# Patient Record
Sex: Male | Born: 1990 | Race: Black or African American | Hispanic: No | Marital: Single | Smoking: Never smoker
Health system: Southern US, Community
[De-identification: ages and names within clinical notes are randomized; demographics above are authoritative.]

## PROBLEM LIST (undated history)

## (undated) DIAGNOSIS — Z87442 Personal history of urinary calculi: Secondary | ICD-10-CM

## (undated) HISTORY — PX: WISDOM TOOTH EXTRACTION: SHX21

---
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
## 2011-05-02 ENCOUNTER — Encounter: Payer: Self-pay | Admitting: Emergency Medicine

## 2011-05-02 ENCOUNTER — Emergency Department (HOSPITAL_COMMUNITY): Payer: 59

## 2011-05-02 ENCOUNTER — Emergency Department (HOSPITAL_COMMUNITY)
Admission: EM | Admit: 2011-05-02 | Discharge: 2011-05-02 | Disposition: A | Discharge status: Home / Self Care | Payer: 59 | Attending: Emergency Medicine | Admitting: Emergency Medicine

## 2011-05-02 DIAGNOSIS — IMO0002 Reserved for concepts with insufficient information to code with codable children: Secondary | ICD-10-CM

## 2011-05-02 DIAGNOSIS — W1789XA Other fall from one level to another, initial encounter: Secondary | ICD-10-CM | POA: Insufficient documentation

## 2011-05-02 DIAGNOSIS — W19XXXA Unspecified fall, initial encounter: Secondary | ICD-10-CM

## 2011-05-02 DIAGNOSIS — S0990XA Unspecified injury of head, initial encounter: Secondary | ICD-10-CM

## 2011-05-02 DIAGNOSIS — F1010 Alcohol abuse, uncomplicated: Secondary | ICD-10-CM | POA: Insufficient documentation

## 2011-05-02 DIAGNOSIS — R51 Headache: Secondary | ICD-10-CM | POA: Insufficient documentation

## 2011-05-02 LAB — CBC
HCT: 40.5 % (ref 39.0–52.0)
MCH: 32.4 pg (ref 26.0–34.0)
MCHC: 35 g/dL (ref 30.0–36.0)
MCHC: 35.1 g/dL (ref 30.0–36.0)
MCV: 92.5 fL (ref 78.0–100.0)
RDW: 12.4 % (ref 11.5–15.5)
RDW: 12.3 % (ref 11.5–15.5)
WBC: 22.8 10*3/uL — ABNORMAL HIGH (ref 4.0–10.5)
WBC: 17.4 10*3/uL — ABNORMAL HIGH (ref 4.0–10.5)

## 2011-05-02 LAB — POCT I-STAT, CHEM 8
BUN: 12 mg/dL (ref 6–23)
Calcium, Ion: 1.21 mmol/L (ref 1.12–1.32)
Chloride: 98 mEq/L (ref 96–112)
Creatinine, Ser: 1 mg/dL (ref 0.50–1.35)
Glucose, Bld: 107 mg/dL — ABNORMAL HIGH (ref 70–99)

## 2011-05-02 LAB — DIFFERENTIAL
Basophils Absolute: 0 10*3/uL (ref 0.0–0.1)
Basophils Relative: 0 % (ref 0–1)
Lymphocytes Relative: 5 % — ABNORMAL LOW (ref 12–46)
Neutro Abs: 20.5 10*3/uL — ABNORMAL HIGH (ref 1.7–7.7)
Neutrophils Relative %: 90 % — ABNORMAL HIGH (ref 43–77)

## 2011-05-02 LAB — RAPID URINE DRUG SCREEN, HOSP PERFORMED
Amphetamines: NOT DETECTED
Benzodiazepines: NOT DETECTED
Cocaine: NOT DETECTED
Opiates: NOT DETECTED
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
## 2011-05-02 NOTE — ED Notes (Signed)
Please contact Father: Dimitriy Carreras at cell: [phone], and the home number is [phone].
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
## 2011-05-02 NOTE — ED Notes (Signed)
Attempted to call father at home and cell with no response.

## 2011-05-02 NOTE — ED Notes (Signed)
Pt able to ambulate. Pt denies need to urinate. Pt states that he feels better.
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
## 2012-06-07 ENCOUNTER — Encounter (HOSPITAL_COMMUNITY): Payer: Self-pay | Admitting: Emergency Medicine

## 2012-06-07 ENCOUNTER — Emergency Department (HOSPITAL_COMMUNITY)
Admission: EM | Admit: 2012-06-07 | Discharge: 2012-06-07 | Disposition: A | Discharge status: Home / Self Care | Payer: 59 | Attending: Emergency Medicine | Admitting: Emergency Medicine

## 2012-06-07 ENCOUNTER — Emergency Department (HOSPITAL_COMMUNITY): Payer: 59

## 2012-06-07 DIAGNOSIS — IMO0002 Reserved for concepts with insufficient information to code with codable children: Secondary | ICD-10-CM | POA: Insufficient documentation

## 2012-06-07 DIAGNOSIS — Y929 Unspecified place or not applicable: Secondary | ICD-10-CM | POA: Insufficient documentation

## 2012-06-07 DIAGNOSIS — S46912A Strain of unspecified muscle, fascia and tendon at shoulder and upper arm level, left arm, initial encounter: Secondary | ICD-10-CM

## 2012-06-07 DIAGNOSIS — Y9389 Activity, other specified: Secondary | ICD-10-CM | POA: Insufficient documentation

## 2012-06-07 DIAGNOSIS — X503XXA Overexertion from repetitive movements, initial encounter: Secondary | ICD-10-CM | POA: Insufficient documentation

## 2012-06-07 MED ORDER — IBUPROFEN 200 MG PO TABS
600.0000 mg | ORAL_TABLET | Freq: Once | ORAL | Status: AC
  Administered 2012-06-07: 600 mg via ORAL
  Filled 2012-06-07: qty 3

## 2012-06-07 MED ORDER — IBUPROFEN 400 MG PO TABS: 600.0000 mg | ORAL_TABLET | Freq: Once | ORAL | Status: DC

## 2012-06-07 MED ORDER — IBUPROFEN 600 MG PO TABS: 600.0000 mg | ORAL_TABLET | Freq: Four times a day (QID) | ORAL | Status: DC | PRN

## 2012-06-07 NOTE — ED Notes (Signed)
Patient ambulatory with radiology tech from xray and escorted to FT 9

## 2012-06-07 NOTE — ED Provider Notes (Signed)
Medical screening examination/treatment/procedure(s) were performed by non-physician practitioner and as supervising physician I was immediately available for consultation/collaboration.  Sunnie Nielsen, MD 06/07/12 [phone]

## 2012-06-07 NOTE — ED Notes (Signed)
PT. 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

## 2012-06-07 NOTE — ED Provider Notes (Signed)
History     CSN: [id number]  Arrival date & time 06/07/12  2952   First MD Initiated Contact with Patient 06/07/12 [phone]      Chief Complaint  Patient presents with  . 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 male presenting with shoulder pain. The history is provided by the patient.  Shoulder Pain This is a new problem. The current episode started in the past 7 days. The problem occurs constantly. The problem has been unchanged. Pertinent negatives include no chest pain, chills, fever, joint swelling, myalgias, numbness, rash or weakness.    History reviewed. No pertinent past medical history.  History reviewed. No pertinent past surgical history.  No family history on file.  History  Substance Use Topics  . Smoking status: Never Smoker   . Smokeless tobacco: Not on file  . Alcohol Use: No      Review of Systems  Constitutional: Negative for fever, chills and activity change.  Respiratory: Negative for shortness of breath.   Cardiovascular: Negative for chest pain.  Gastrointestinal: Negative.   Musculoskeletal: Negative for myalgias and joint swelling.  Skin: Negative for rash and wound.  Neurological: Negative for dizziness, weakness and numbness.    Allergies  Peanut-containing drug products  Home Medications   Current Outpatient Rx  Name  Route  Sig  Dispense  Refill  . IBUPROFEN 600 MG PO TABS   Oral   Take 1 tablet (600 mg total) by mouth every 6 (six) hours as needed for pain.   30 tablet   0     BP 127/71  Pulse 62  Temp 98.1 F (36.7 C) (Oral)  Resp 14  SpO2 100%  Physical Exam  Constitutional: He appears well-developed and well-nourished.  Neck: Normal range of  motion.  Cardiovascular: Normal rate.   Pulmonary/Chest: Effort normal. He exhibits tenderness.  Musculoskeletal: Normal range of motion. He exhibits no edema and no tenderness.       No decreased in ROM   Neurological: He is alert.  Skin: Skin is warm.    ED Course  Procedures (including critical care time)  Labs Reviewed - No data to display Dg Shoulder Left  06/07/2012  *RADIOLOGY REPORT*  Clinical Data: Left shoulder pain, no known injury  LEFT SHOULDER - 2+ VIEW  Comparison: None.  Findings: No fracture or dislocation is seen.  The joint spaces are preserved.  The visualized soft tissues are unremarkable.  Visualized left lung is clear.  IMPRESSION: Normal left shoulder radiographs.   Original Report Authenticated By: Charline Bills, M.D.      1. Left shoulder strain       MDM  Muscle strain        Arman Filter, NP 06/07/12 [phone]

## 2013-12-13 ENCOUNTER — Encounter (HOSPITAL_COMMUNITY): Payer: Self-pay | Admitting: Emergency Medicine

## 2013-12-13 ENCOUNTER — Emergency Department (HOSPITAL_COMMUNITY)
Admission: EM | Admit: 2013-12-13 | Discharge: 2013-12-13 | Disposition: A | Discharge status: Home / Self Care | Payer: Managed Care, Other (non HMO) | Attending: Emergency Medicine | Admitting: Emergency Medicine

## 2013-12-13 DIAGNOSIS — R369 Urethral discharge, unspecified: Secondary | ICD-10-CM

## 2013-12-13 DIAGNOSIS — R599 Enlarged lymph nodes, unspecified: Secondary | ICD-10-CM | POA: Insufficient documentation

## 2013-12-13 LAB — URINALYSIS, ROUTINE W REFLEX MICROSCOPIC
GLUCOSE, UA: NEGATIVE mg/dL
Ketones, ur: NEGATIVE mg/dL
NITRITE: NEGATIVE
PH: 6 (ref 5.0–8.0)
PROTEIN: 30 mg/dL — AB
Specific Gravity, Urine: >1.03 — ABNORMAL HIGH (ref 1.005–1.030)
Urobilinogen, UA: 1 mg/dL (ref 0.0–1.0)

## 2013-12-13 LAB — URINE MICROSCOPIC-ADD ON

## 2013-12-13 MED ORDER — AZITHROMYCIN 250 MG PO TABS
1000.0000 mg | ORAL_TABLET | Freq: Once | ORAL | Status: AC
  Administered 2013-12-13: 1000 mg via ORAL
  Filled 2013-12-13: qty 4

## 2013-12-13 MED ORDER — CEFTRIAXONE SODIUM 250 MG IJ SOLR
250.0000 mg | Freq: Once | INTRAMUSCULAR | Status: AC
  Administered 2013-12-13: 250 mg via INTRAMUSCULAR
  Filled 2013-12-13: qty 250

## 2013-12-13 MED ORDER — LIDOCAINE HCL (PF) 1 % IJ SOLN
INTRAMUSCULAR | Status: AC
  Administered 2013-12-13: 5 mL
  Filled 2013-12-13: qty 5

## 2013-12-13 NOTE — ED Provider Notes (Signed)
CSN: [id number]     Arrival date & time 12/13/13  [id number] History   First MD Initiated Contact with Patient 12/13/13 [phone]     Chief Complaint  Patient presents with  . Hematuria     (Consider location/radiation/quality/duration/timing/severity/associated sxs/prior Treatment) Patient is a 23 y.o. male presenting with hematuria. The history is provided by the patient.  Hematuria This is a new problem. The current episode started 3 to 5 hours ago. The problem occurs constantly. The problem has not changed since onset.Pertinent negatives include no chest pain and no shortness of breath. Nothing aggravates the symptoms. Nothing relieves the symptoms.    History reviewed. No pertinent past medical history. History reviewed. No pertinent past surgical history. History reviewed. No pertinent family history. History  Substance Use Topics  . Smoking status: Never Smoker   . Smokeless tobacco: Not on file  . Alcohol Use: No    Review of Systems  Constitutional: Negative for fever and chills.  Respiratory: Negative for cough and shortness of breath.   Cardiovascular: Negative for chest pain.  Genitourinary: Positive for hematuria.  All other systems reviewed and are negative.     Allergies  Peanut-containing drug products  Home Medications   Prior to Admission medications   Medication Sig Start Date End Date Taking? Authorizing Dawon Troop  ibuprofen (ADVIL,MOTRIN) 600 MG tablet Take 1 tablet (600 mg total) by mouth every 6 (six) hours as needed for pain. 06/07/12   Arman FilterGail K Schulz, NP   BP 123/71  Pulse 72  Temp(Src) 97.9 F (36.6 C) (Oral)  Resp 18  Ht 5\' 7"  (1.702 m)  Wt 155 lb (70.308 kg)  BMI 24.27 kg/m2  SpO2 99% Physical Exam  Nursing note and vitals reviewed. Constitutional: He is oriented to person, place, and time. He appears well-developed and well-nourished. No distress.  HENT:  Head: Normocephalic and atraumatic.  Mouth/Throat: Oropharynx is clear and moist. No  oropharyngeal exudate.  Eyes: EOM are normal. Pupils are equal, round, and reactive to light.  Neck: Normal range of motion. Neck supple.  Cardiovascular: Normal rate and regular rhythm.  Exam reveals no friction rub.   No murmur heard. Pulmonary/Chest: Effort normal and breath sounds normal. No respiratory distress. He has no wheezes. He has no rales.  Abdominal: Soft. He exhibits no distension. There is no tenderness. There is no rebound.  Genitourinary: No paraphimosis, hypospadias or penile tenderness. Discharge found.  Musculoskeletal: Normal range of motion. He exhibits no edema.  Lymphadenopathy:       Left: Inguinal (tender) adenopathy present.  Neurological: He is alert and oriented to person, place, and time. He exhibits normal muscle tone.  Skin: No rash noted. He is not diaphoretic.    ED Course  Procedures (including critical care time) Labs Review Labs Reviewed  URINALYSIS, ROUTINE W REFLEX MICROSCOPIC - Abnormal; Notable for the following:    Color, Urine BROWN (*)    APPearance CLOUDY (*)    Specific Gravity, Urine >1.030 (*)    Hgb urine dipstick LARGE (*)    Bilirubin Urine SMALL (*)    Protein, ur 30 (*)    Leukocytes, UA MODERATE (*)    All other components within normal limits  GC/CHLAMYDIA PROBE AMP  URINE MICROSCOPIC-ADD ON    Imaging Review No results found.   EKG Interpretation None      MDM   Final diagnoses:  Penile discharge    46M presents with acute onset of penile discharge. Mild bloody and purulent discharge. L inguinal  lymphadenopathy, likely reactive. Mild dysuria. Given antibiotics for GC/Ch. Discharged.     Dagmar HaitWilliam Blair Walden, MD 12/13/13 [phone]

## 2013-12-13 NOTE — ED Notes (Addendum)
Patient presents from home stating he noticed blood in his urine this AM  Denies back pain, states he has a lump in his left groin area.  Parents st bedside.

## 2013-12-14 LAB — GC/CHLAMYDIA PROBE AMP
CT PROBE, AMP APTIMA: POSITIVE — AB
GC Probe RNA: POSITIVE — AB

## 2013-12-15 ENCOUNTER — Telehealth (HOSPITAL_BASED_OUTPATIENT_CLINIC_OR_DEPARTMENT_OTHER): Payer: Self-pay | Admitting: Emergency Medicine

## 2013-12-15 NOTE — Telephone Encounter (Signed)
Positive Gonorrhea & Positive Chlamydia cultures. treated with Rocephin and Zithromax, sensitive to same per protocol [facility]. [facility]  Patient contact 12/15/13 of positive results and that treatment was given in ED. STD instructions provided, pt verbalized understanding.

## 2014-04-25 ENCOUNTER — Ambulatory Visit: Payer: 59 | Admitting: Family

## 2016-12-24 ENCOUNTER — Emergency Department (HOSPITAL_COMMUNITY): Payer: No Typology Code available for payment source

## 2016-12-24 ENCOUNTER — Encounter (HOSPITAL_COMMUNITY): Payer: Self-pay | Admitting: Emergency Medicine

## 2016-12-24 ENCOUNTER — Emergency Department (HOSPITAL_COMMUNITY)
Admission: EM | Admit: 2016-12-24 | Discharge: 2016-12-24 | Disposition: A | Discharge status: Home / Self Care | Payer: No Typology Code available for payment source | Attending: Emergency Medicine | Admitting: Emergency Medicine

## 2016-12-24 DIAGNOSIS — S6982XA Other specified injuries of left wrist, hand and finger(s), initial encounter: Secondary | ICD-10-CM | POA: Diagnosis present

## 2016-12-24 DIAGNOSIS — Y92410 Unspecified street and highway as the place of occurrence of the external cause: Secondary | ICD-10-CM | POA: Insufficient documentation

## 2016-12-24 DIAGNOSIS — Y999 Unspecified external cause status: Secondary | ICD-10-CM | POA: Diagnosis not present

## 2016-12-24 DIAGNOSIS — Y939 Activity, unspecified: Secondary | ICD-10-CM | POA: Diagnosis not present

## 2016-12-24 DIAGNOSIS — S60222A Contusion of left hand, initial encounter: Secondary | ICD-10-CM | POA: Diagnosis not present

## 2016-12-24 MED ORDER — METHOCARBAMOL 500 MG PO TABS: 500.0000 mg | ORAL_TABLET | Freq: Two times a day (BID) | ORAL | 0 refills | Status: DC

## 2016-12-24 MED ORDER — NAPROXEN 375 MG PO TABS: 375.0000 mg | ORAL_TABLET | Freq: Two times a day (BID) | ORAL | 0 refills | Status: DC
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
## 2016-12-24 NOTE — ED Notes (Signed)
Back from xray

## 2016-12-24 NOTE — ED Notes (Signed)
Taken to xray at this time. 
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
## 2016-12-29 ENCOUNTER — Encounter (HOSPITAL_COMMUNITY): Payer: Self-pay | Admitting: Emergency Medicine

## 2016-12-29 ENCOUNTER — Ambulatory Visit (INDEPENDENT_AMBULATORY_CARE_PROVIDER_SITE_OTHER): Payer: Commercial Managed Care - PPO | Admitting: Physician Assistant

## 2016-12-29 ENCOUNTER — Ambulatory Visit (INDEPENDENT_AMBULATORY_CARE_PROVIDER_SITE_OTHER): Payer: Commercial Managed Care - PPO

## 2016-12-29 ENCOUNTER — Emergency Department (HOSPITAL_COMMUNITY)
Admission: EM | Admit: 2016-12-29 | Discharge: 2016-12-29 | Disposition: A | Discharge status: Home / Self Care | Payer: No Typology Code available for payment source | Attending: Emergency Medicine | Admitting: Emergency Medicine

## 2016-12-29 ENCOUNTER — Encounter: Payer: Self-pay | Admitting: Physician Assistant

## 2016-12-29 VITALS — BP 120/80 | HR 73 | Temp 98.5°F | Ht 67.5 in | Wt 158.0 lb

## 2016-12-29 DIAGNOSIS — M545 Low back pain, unspecified: Secondary | ICD-10-CM

## 2016-12-29 DIAGNOSIS — Z91010 Allergy to peanuts: Secondary | ICD-10-CM | POA: Insufficient documentation

## 2016-12-29 DIAGNOSIS — Z79899 Other long term (current) drug therapy: Secondary | ICD-10-CM | POA: Insufficient documentation

## 2016-12-29 DIAGNOSIS — H9202 Otalgia, left ear: Secondary | ICD-10-CM | POA: Diagnosis present

## 2016-12-29 DIAGNOSIS — R350 Frequency of micturition: Secondary | ICD-10-CM

## 2016-12-29 LAB — URINALYSIS, ROUTINE W REFLEX MICROSCOPIC
BILIRUBIN URINE: NEGATIVE
Hgb urine dipstick: NEGATIVE
Ketones, ur: NEGATIVE
LEUKOCYTES UA: NEGATIVE
NITRITE: NEGATIVE
RBC / HPF: NONE SEEN (ref 0–?)
Specific Gravity, Urine: >=1.03 — AB (ref 1.000–1.030)
Total Protein, Urine: NEGATIVE
URINE GLUCOSE: NEGATIVE
Urobilinogen, UA: 0.2 (ref 0.0–1.0)
pH: 6 (ref 5.0–8.0)

## 2016-12-29 MED ORDER — METHYLPREDNISOLONE 4 MG PO TBPK: ORAL_TABLET | ORAL | 0 refills | Status: DC

## 2016-12-29 MED ORDER — IBUPROFEN 600 MG PO TABS: 600.0000 mg | ORAL_TABLET | Freq: Four times a day (QID) | ORAL | 0 refills | Status: DC | PRN

## 2016-12-29 MED ORDER — AMOXICILLIN 500 MG PO CAPS: 500.0000 mg | ORAL_CAPSULE | Freq: Three times a day (TID) | ORAL | 0 refills | Status: DC
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
## 2016-12-29 NOTE — Patient Instructions (Addendum)
It was great to meet you!  MEDROL DOSE PACK Usual Directions for Use Directions for Medrol Dosepak (4 mg tablets): Day 1: Two tablets before breakfast, one after lunch, one after dinner, and two at bedtime. If started late in the day, take all six tablets at once or divide into two or three doses, unless otherwise directed by prescriber. Day 2: One tablet before breakfast, one after lunch, one after dinner, and two at bedtime Day 3: One tablet before breakfast, one after lunch, one after dinner, and one at bedtime Day 4: One tablet before breakfast, one after lunch, and one at bedtime Day 5: One tablet before breakfast and one at bedtime Day 6: One tablet before breakfast   We will call you with your xray and urine results.  Follow-up with Dr. Gaspar BiddingMichael Rigby (sports medicine doctor) here in our office if your back pain worsens or does not improve with treatment.  If severe pain, fevers or loss of ability to control bowel/bladder --> go to the ER!  Follow-up with us in 1 month to complete a physical.
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
## 2016-12-29 NOTE — ED Notes (Signed)
Pt verbalized understanding discharge instructions and denies any further needs or questions at this time. VS stable, ambulatory and steady gait.   

## 2017-01-01 ENCOUNTER — Ambulatory Visit: Payer: Commercial Managed Care - PPO | Admitting: Family Medicine

## 2017-01-06 ENCOUNTER — Encounter: Payer: Self-pay | Admitting: *Deleted

## 2019-08-12 ENCOUNTER — Encounter (HOSPITAL_COMMUNITY): Payer: Self-pay

## 2019-08-12 ENCOUNTER — Ambulatory Visit (HOSPITAL_COMMUNITY)
Admission: EM | Admit: 2019-08-12 | Discharge: 2019-08-12 | Disposition: A | Discharge status: Home / Self Care | Payer: BC Managed Care – PPO | Attending: Family Medicine | Admitting: Family Medicine

## 2019-08-12 ENCOUNTER — Other Ambulatory Visit: Payer: Self-pay

## 2019-08-12 ENCOUNTER — Ambulatory Visit (INDEPENDENT_AMBULATORY_CARE_PROVIDER_SITE_OTHER): Payer: BC Managed Care – PPO

## 2019-08-12 DIAGNOSIS — R109 Unspecified abdominal pain: Secondary | ICD-10-CM

## 2019-08-12 MED ORDER — POLYETHYLENE GLYCOL 3350 17 GM/SCOOP PO POWD: 1.0000 | Freq: Once | ORAL | 0 refills | Status: AC

## 2019-08-12 NOTE — Discharge Instructions (Signed)
You appear to be constipated I sent some MiraLAX to the pharmacy for you to start taking.  You can do 1 scoop twice a day until good bowel movement.  Once you have a good bowel movement you can back off to once a day or once every couple days. If you are drinking plenty of water If abdominal pain worsens and you start developing nausea, vomiting or fever she will need to go to the ER.

## 2019-08-12 NOTE — ED Triage Notes (Signed)
Pt states he has stomach pains for 5 days. Pt he has not tried and meds for the stomach pain.

## 2019-08-13 NOTE — ED Provider Notes (Addendum)
[facility]    CSN: [id number] Arrival date & time: 08/12/19  1041      History   Chief Complaint Chief Complaint  Patient presents with  . Abdominal Pain    HPI ZYAD BOOMER is a 29 y.o. male.   Patient is a 29 year old male with no significant past medical history.  He presents today for approximate 5 days of generalized stomach discomfort, bloating.  Symptoms have been constant.  He has not tried anything for his symptoms.  Denies any associated nausea, vomiting, diarrhea, fevers, urinary symptoms.  Reporting last bowel movement was yesterday.  ROS per HPI      History reviewed. No pertinent past medical history.  There are no problems to display for this patient.   History reviewed. No pertinent surgical history.     Home Medications    Prior to Admission medications   Not on File    Family History History reviewed. No pertinent family history.  Social History Social History   Tobacco Use  . Smoking status: Never Smoker  . Smokeless tobacco: Never Used  Substance Use Topics  . Alcohol use: No  . Drug use: No    Types: Marijuana    Comment: last time was a month ago per pt     Allergies   Peanut-containing drug products   Review of Systems Review of Systems   Physical Exam Triage Vital Signs ED Triage Vitals  Enc Vitals Group     BP 08/12/19 1116 (!) 145/79     Pulse Rate 08/12/19 1116 72     Resp 08/12/19 1116 18     Temp 08/12/19 1116 98.3 F (36.8 C)     Temp Source 08/12/19 1116 Oral     SpO2 08/12/19 1116 100 %     Weight 08/12/19 1115 196 lb (88.9 kg)     Height --      Head Circumference --      Peak Flow --      Pain Score 08/12/19 1115 9     Pain Loc --      Pain Edu? --      Excl. in GC? --    No data found.  Updated Vital Signs BP (!) 145/79 (BP Location: Right Arm)   Pulse 72   Temp 98.3 F (36.8 C) (Oral)   Resp 18   Wt 196 lb (88.9 kg)   SpO2 100%   BMI 30.24 kg/m   Visual  Acuity Right Eye Distance:   Left Eye Distance:   Bilateral Distance:    Right Eye Near:   Left Eye Near:    Bilateral Near:     Physical Exam Vitals and nursing note reviewed.  Constitutional:      Appearance: Normal appearance.  HENT:     Head: Normocephalic and atraumatic.     Nose: Nose normal.  Eyes:     Conjunctiva/sclera: Conjunctivae normal.  Pulmonary:     Effort: Pulmonary effort is normal.  Abdominal:     General: Bowel sounds are normal. There is distension.     Palpations: Abdomen is soft. There is no hepatomegaly or splenomegaly.     Tenderness: There is abdominal tenderness.  Musculoskeletal:        General: Normal range of motion.     Cervical back: Normal range of motion.  Skin:    General: Skin is warm and dry.  Neurological:     Mental Status: He is alert.  Psychiatric:  Mood and Affect: Mood normal.      UC Treatments / Results  Labs (all labs ordered are listed, but only abnormal results are displayed) Labs Reviewed - No data to display  EKG   Radiology DG Abd 2 Views  Result Date: 08/12/2019 CLINICAL DATA:  Abdominal pain x5 days EXAM: ABDOMEN - 2 VIEW COMPARISON:  None. FINDINGS: Nonobstructive bowel gas pattern. Mild to moderate right colonic stool burden. No evidence of free air under the diaphragm on the upright view. Visualized osseous structures are within normal limits. IMPRESSION: Unremarkable abdominal radiographs. Electronically Signed   By: Julian Hy M.D.   On: 08/12/2019 11:55    Procedures Procedures (including critical care time)  Medications Ordered in UC Medications - No data to display  Initial Impression / Assessment and Plan / UC Course  I have reviewed the triage vital signs and the nursing notes.  Pertinent labs & imaging results that were available during my care of the patient were reviewed by me and considered in my medical decision making (see chart for details).     Constipation-most likely  diagnosis based on symptoms and exam. X ray without anything concerning  No red flags Treating with MiraLAX Recommended increase water intake and follow-up for any continued or worsening problems. Final Clinical Impressions(s) / UC Diagnoses   Final diagnoses:  Abdominal pain, unspecified abdominal location     Discharge Instructions     You appear to be constipated I sent some MiraLAX to the pharmacy for you to start taking.  You can do 1 scoop twice a day until good bowel movement.  Once you have a good bowel movement you can back off to once a day or once every couple days. If you are drinking plenty of water If abdominal pain worsens and you start developing nausea, vomiting or fever she will need to go to the ER.    ED Prescriptions    Medication Sig Dispense Auth. Provider   polyethylene glycol powder (GLYCOLAX/MIRALAX) 17 GM/SCOOP powder Take 255 g by mouth once for 1 dose. 255 g Loura Halt A, NP     PDMP not reviewed this encounter.   Orvan July, NP 08/13/19 1937    Orvan July, NP 08/13/19 [id number]

## 2020-08-23 IMAGING — DX DG ABDOMEN 2V
3 series · 3 of 3 positions shown · non-contrast
Comparison: None.

CLINICAL DATA: Abdominal pain x5 days

EXAM:
ABDOMEN - 2 VIEW

[abdomen erect]
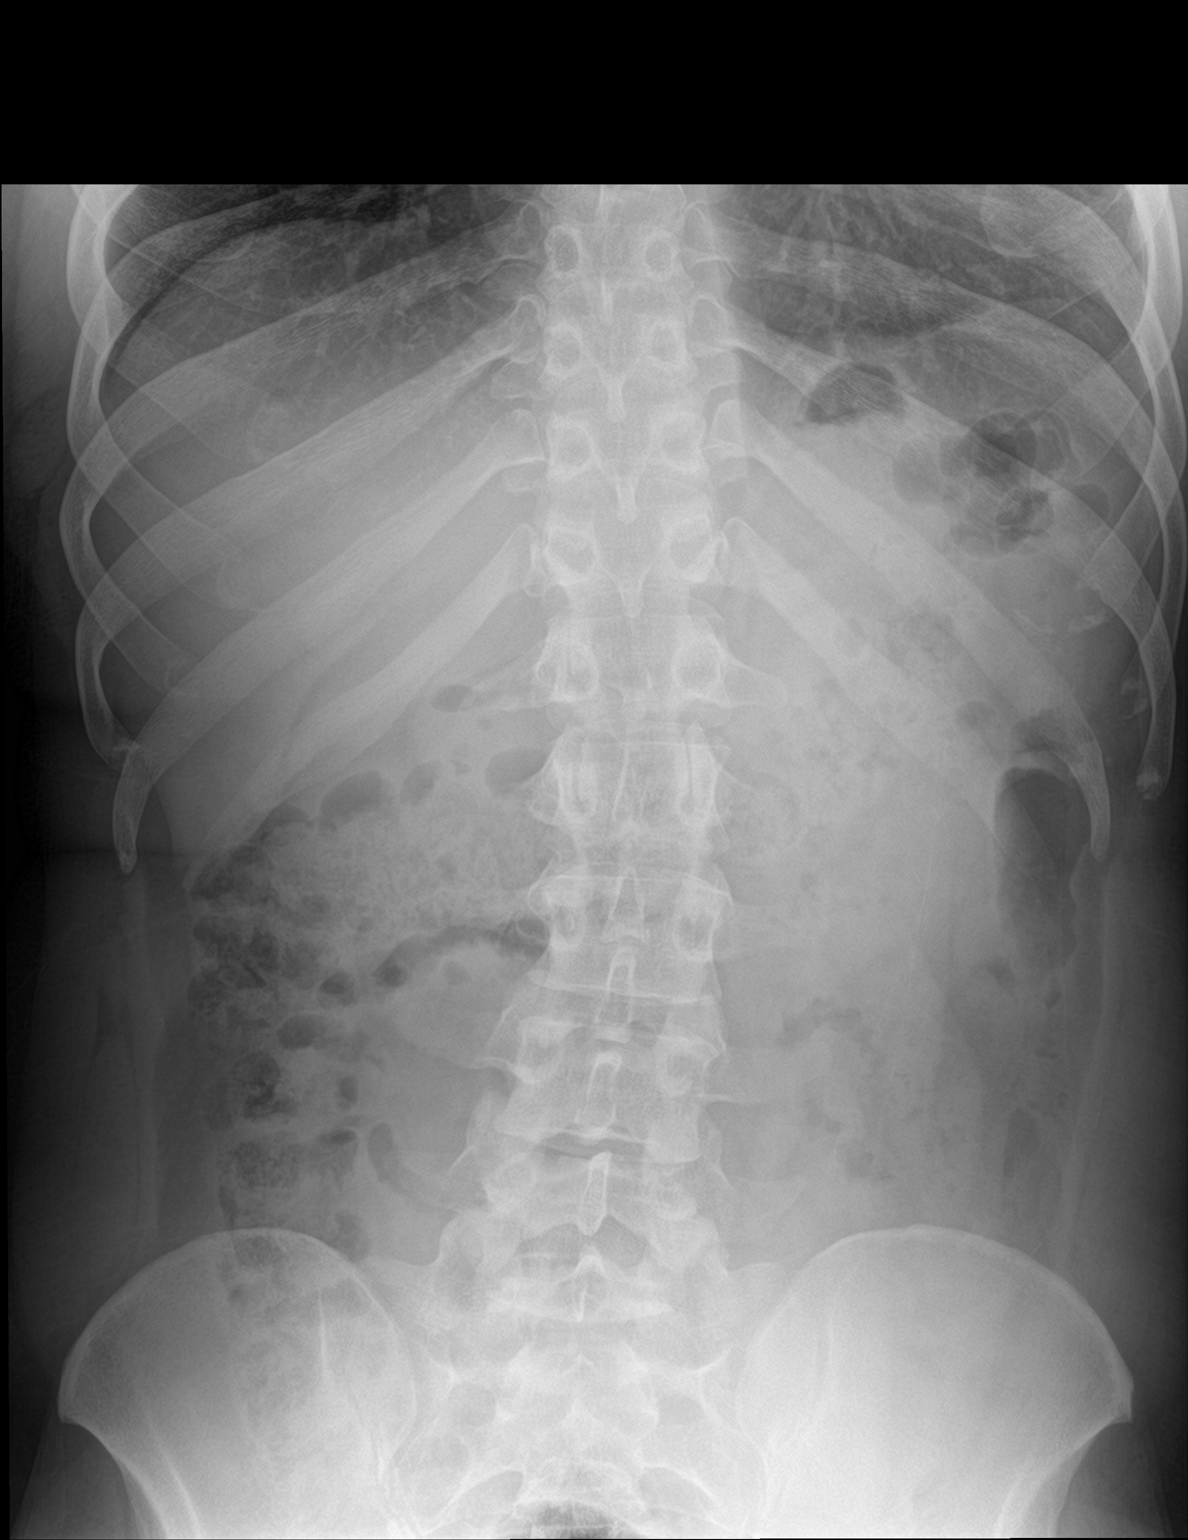

[abdomen supine (1 of 2)]
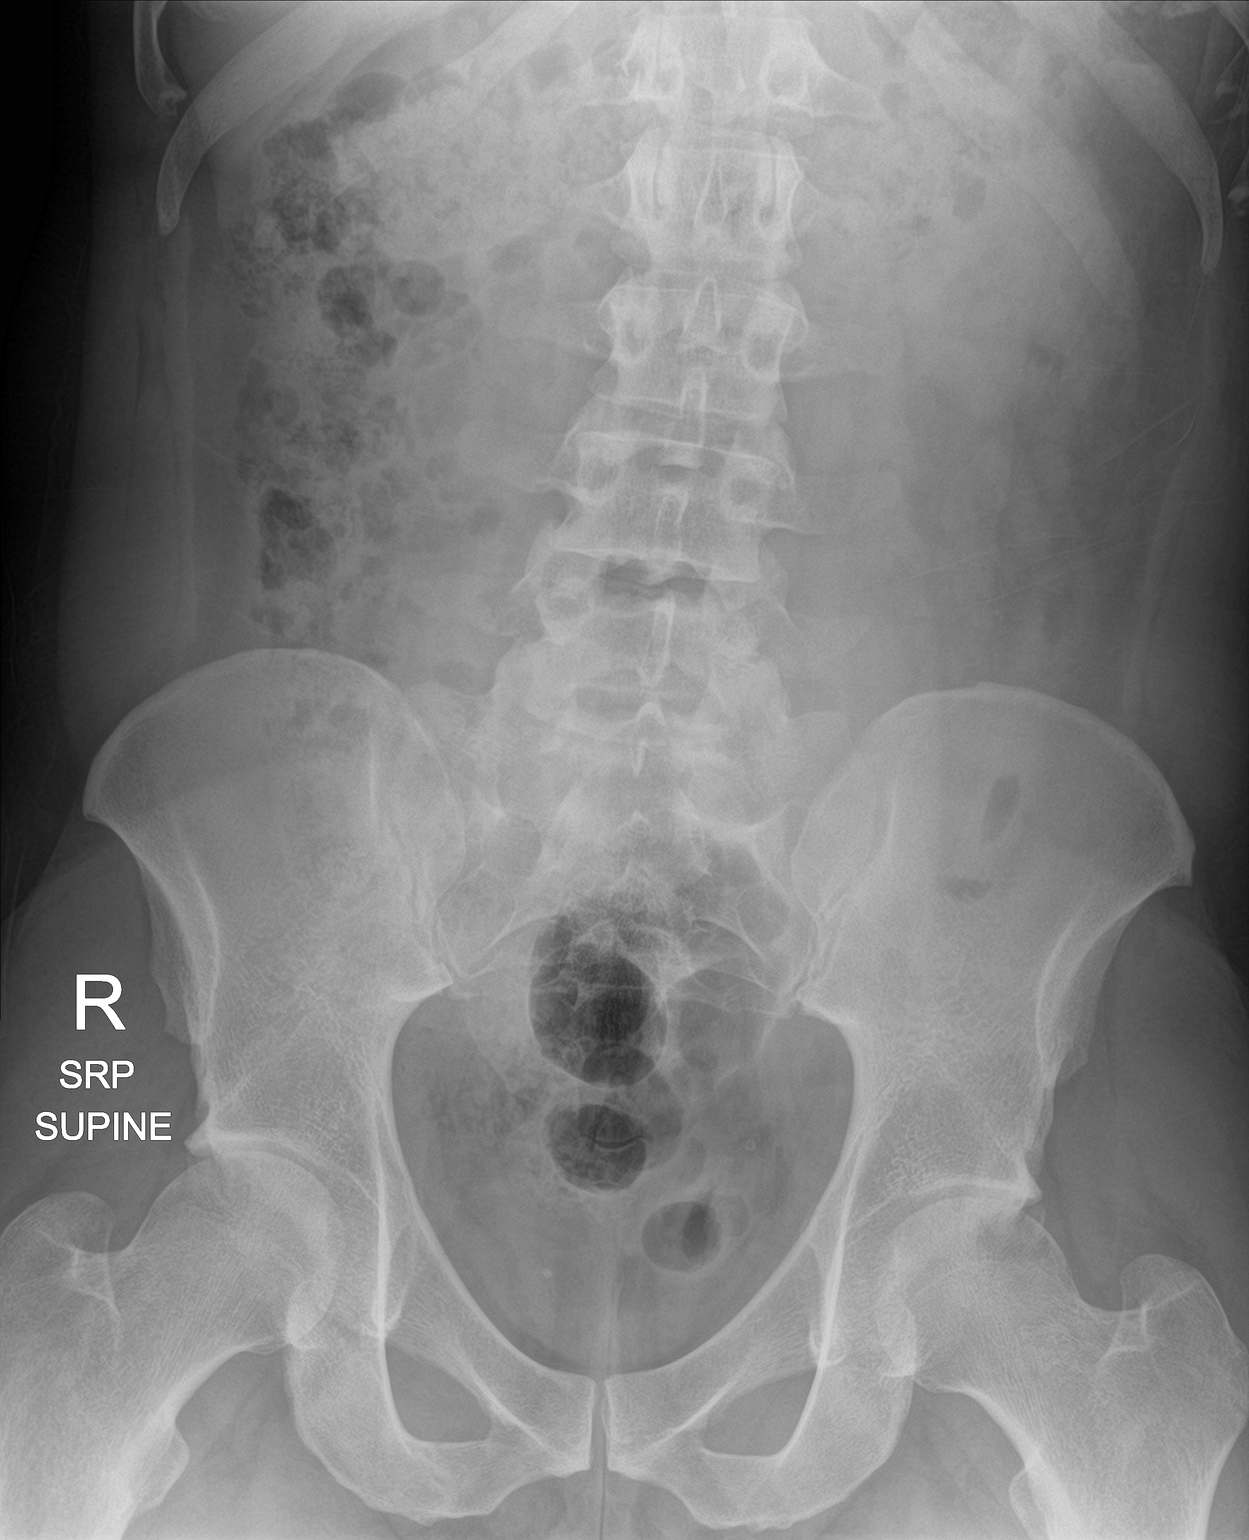

[abdomen supine (2 of 2)]
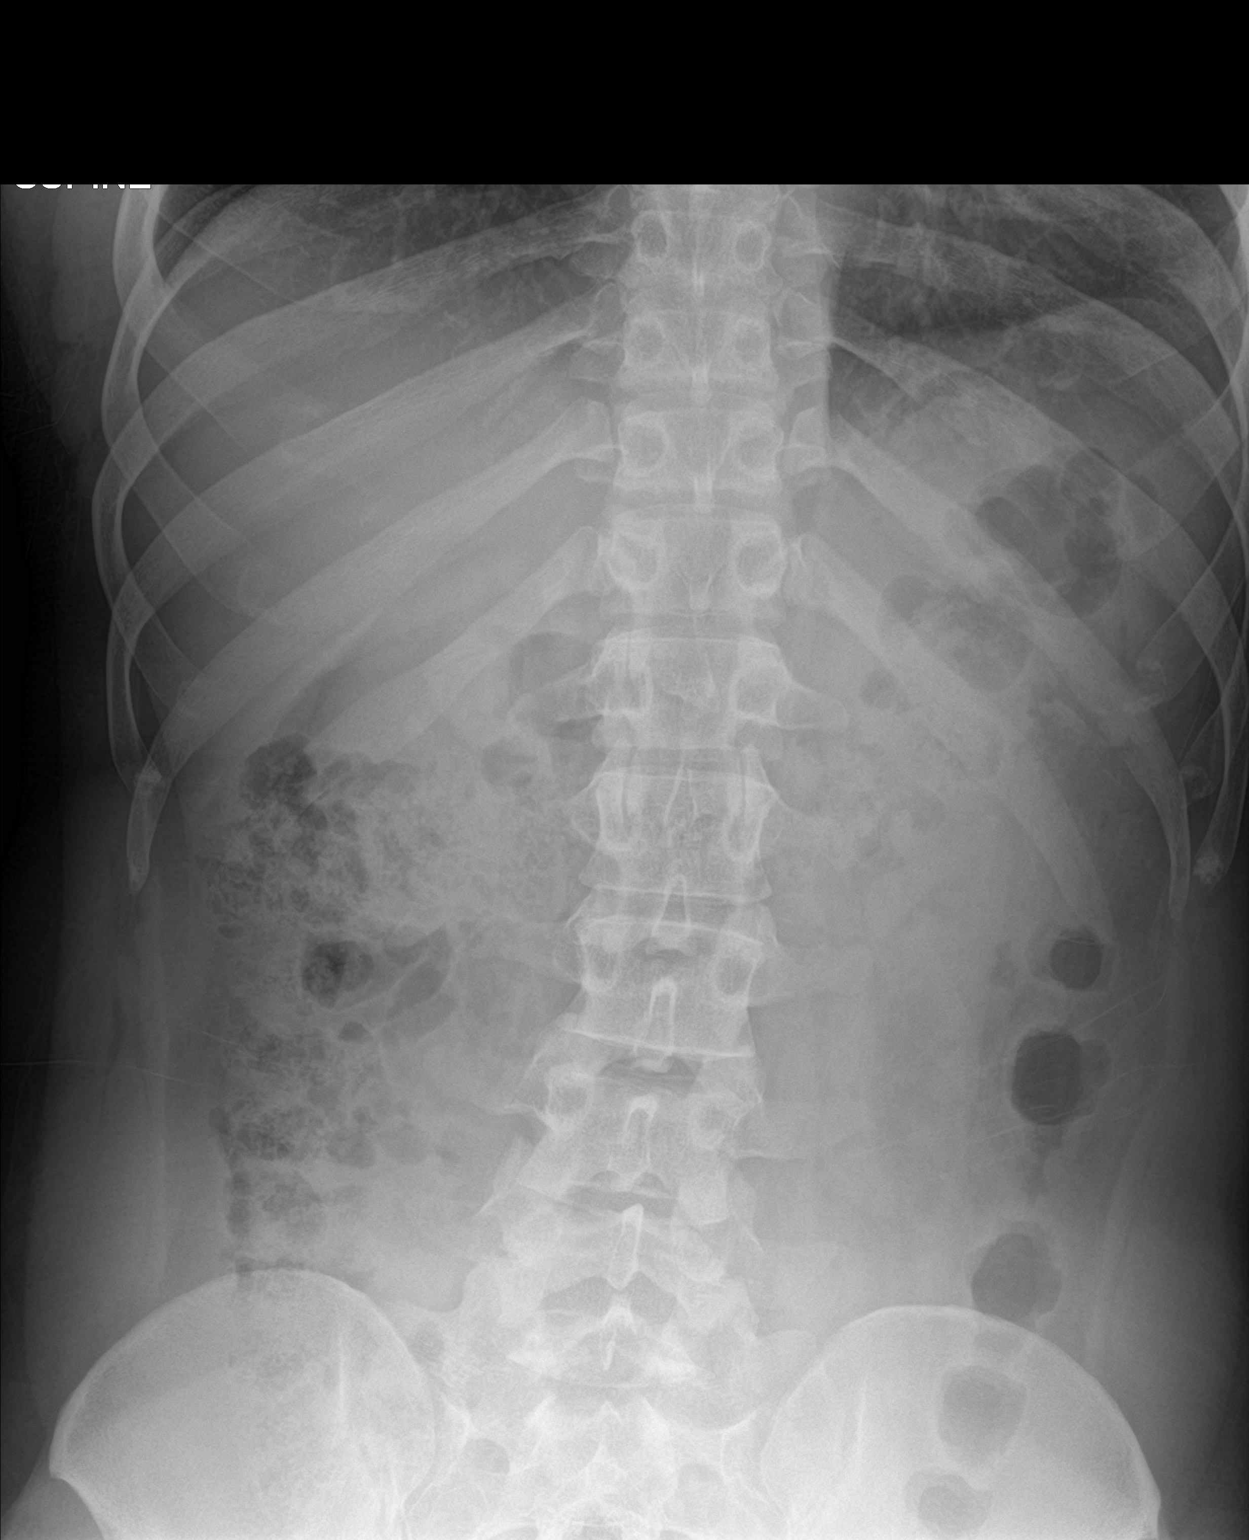

[3 of 3 positions shown; findings below may reference images not displayed]

FINDINGS: Nonobstructive bowel gas pattern. Mild to moderate right colonic
stool burden.

No evidence of free air under the diaphragm on the upright view.

Visualized osseous structures are within normal limits.
IMPRESSION: Unremarkable abdominal radiographs.

## 2021-11-02 ENCOUNTER — Other Ambulatory Visit: Payer: Self-pay

## 2021-11-02 ENCOUNTER — Emergency Department (HOSPITAL_COMMUNITY): Payer: Self-pay

## 2021-11-02 ENCOUNTER — Emergency Department (HOSPITAL_COMMUNITY)
Admission: EM | Admit: 2021-11-02 | Discharge: 2021-11-02 | Disposition: A | Discharge status: Home / Self Care | Payer: Self-pay | Attending: Emergency Medicine | Admitting: Emergency Medicine

## 2021-11-02 ENCOUNTER — Encounter (HOSPITAL_COMMUNITY): Payer: Self-pay | Admitting: *Deleted

## 2021-11-02 DIAGNOSIS — N200 Calculus of kidney: Secondary | ICD-10-CM

## 2021-11-02 DIAGNOSIS — N132 Hydronephrosis with renal and ureteral calculous obstruction: Secondary | ICD-10-CM | POA: Insufficient documentation

## 2021-11-02 DIAGNOSIS — Z20822 Contact with and (suspected) exposure to covid-19: Secondary | ICD-10-CM | POA: Insufficient documentation

## 2021-11-02 DIAGNOSIS — R824 Acetonuria: Secondary | ICD-10-CM | POA: Insufficient documentation

## 2021-11-02 DIAGNOSIS — D72829 Elevated white blood cell count, unspecified: Secondary | ICD-10-CM | POA: Insufficient documentation

## 2021-11-02 DIAGNOSIS — R1032 Left lower quadrant pain: Secondary | ICD-10-CM

## 2021-11-02 DIAGNOSIS — K5900 Constipation, unspecified: Secondary | ICD-10-CM | POA: Insufficient documentation

## 2021-11-02 LAB — COMPREHENSIVE METABOLIC PANEL
ALT: 16 U/L (ref 0–44)
AST: 24 U/L (ref 15–41)
Albumin: 4.7 g/dL (ref 3.5–5.0)
Alkaline Phosphatase: 49 U/L (ref 38–126)
Anion gap: 10 (ref 5–15)
BUN: 16 mg/dL (ref 6–20)
CO2: 26 mmol/L (ref 22–32)
Calcium: 9.8 mg/dL (ref 8.9–10.3)
Chloride: 101 mmol/L (ref 98–111)
Creatinine, Ser: 1.89 mg/dL — ABNORMAL HIGH (ref 0.61–1.24)
GFR, Estimated: 49 mL/min — ABNORMAL LOW (ref >60)
Glucose, Bld: 103 mg/dL — ABNORMAL HIGH (ref 70–99)
Potassium: 4 mmol/L (ref 3.5–5.1)
Sodium: 137 mmol/L (ref 135–145)
Total Bilirubin: 0.8 mg/dL (ref 0.3–1.2)
Total Protein: 7.7 g/dL (ref 6.5–8.1)

## 2021-11-02 LAB — CBC
HCT: 42.1 % (ref 39.0–52.0)
Hemoglobin: 14.7 g/dL (ref 13.0–17.0)
MCH: 32.2 pg (ref 26.0–34.0)
MCHC: 34.9 g/dL (ref 30.0–36.0)
MCV: 92.3 fL (ref 80.0–100.0)
Platelets: 183 10*3/uL (ref 150–400)
RBC: 4.56 MIL/uL (ref 4.22–5.81)
RDW: 12.2 % (ref 11.5–15.5)
WBC: 18 10*3/uL — ABNORMAL HIGH (ref 4.0–10.5)
nRBC: 0 % (ref 0.0–0.2)

## 2021-11-02 LAB — URINALYSIS, ROUTINE W REFLEX MICROSCOPIC
Bacteria, UA: NONE SEEN
Bilirubin Urine: NEGATIVE
Glucose, UA: NEGATIVE mg/dL
Ketones, ur: 80 mg/dL — AB
Leukocytes,Ua: NEGATIVE
Nitrite: NEGATIVE
Protein, ur: NEGATIVE mg/dL
Specific Gravity, Urine: 1.028 (ref 1.005–1.030)
pH: 5 (ref 5.0–8.0)

## 2021-11-02 LAB — RESP PANEL BY RT-PCR (FLU A&B, COVID) ARPGX2
Influenza A by PCR: NEGATIVE
Influenza B by PCR: NEGATIVE
SARS Coronavirus 2 by RT PCR: NEGATIVE

## 2021-11-02 LAB — LIPASE, BLOOD: Lipase: 20 U/L (ref 11–51)

## 2021-11-02 MED ORDER — KETOROLAC TROMETHAMINE 15 MG/ML IJ SOLN
15.0000 mg | Freq: Once | INTRAMUSCULAR | Status: AC
  Administered 2021-11-02: 15 mg via INTRAVENOUS
  Filled 2021-11-02: qty 1

## 2021-11-02 MED ORDER — ONDANSETRON HCL 4 MG/2ML IJ SOLN: 4.0000 mg | Freq: Once | INTRAMUSCULAR | Status: AC

## 2021-11-02 MED ORDER — SODIUM CHLORIDE 0.9 % IV BOLUS
1000.0000 mL | Freq: Once | INTRAVENOUS | Status: AC
  Administered 2021-11-02: 1000 mL via INTRAVENOUS

## 2021-11-02 MED ORDER — ONDANSETRON 4 MG PO TBDP: 4.0000 mg | ORAL_TABLET | Freq: Three times a day (TID) | ORAL | 0 refills | Status: AC | PRN

## 2021-11-02 MED ORDER — GLYCERIN (LAXATIVE) 2 G RE SUPP
1.0000 | Freq: Once | RECTAL | Status: AC
  Administered 2021-11-02: 1 via RECTAL
  Filled 2021-11-02: qty 1

## 2021-11-02 MED ORDER — IOHEXOL 9 MG/ML PO SOLN
ORAL | Status: AC
  Filled 2021-11-02: qty 1000

## 2021-11-02 MED ORDER — LACTATED RINGERS IV BOLUS
1000.0000 mL | Freq: Once | INTRAVENOUS | Status: AC
  Administered 2021-11-02: 1000 mL via INTRAVENOUS

## 2021-11-02 MED ORDER — IOHEXOL 300 MG/ML  SOLN
100.0000 mL | Freq: Once | INTRAMUSCULAR | Status: AC | PRN
  Administered 2021-11-02: 100 mL via INTRAVENOUS

## 2021-11-02 MED ORDER — ONDANSETRON HCL 4 MG/2ML IJ SOLN
INTRAMUSCULAR | Status: AC
  Administered 2021-11-02: 4 mg via INTRAVENOUS
  Filled 2021-11-02: qty 2

## 2021-11-02 NOTE — ED Triage Notes (Signed)
Pt a/o abdominal pain  for 2 days he reports that he is constipated no b,m for 2 days ?

## 2021-11-02 NOTE — ED Notes (Signed)
Patient transported to CT 

## 2021-11-02 NOTE — ED Notes (Signed)
Pt actively vomiting, MD notifed  ?

## 2021-11-02 NOTE — Discharge Instructions (Signed)
As discussed, you have been diagnosed with a kidney stone.  It is importantly follow-up with our urology colleagues.  Please call tomorrow, the office is expecting this, to schedule close follow-up for consideration of interventions as needed. ? ?Return here for concerning changes in your condition. ?

## 2021-11-02 NOTE — ED Provider Notes (Signed)
?[facility] EMERGENCY DEPARTMENT ?Provider Note ? ? ?CSN: [id number] ?Arrival date & time: 11/02/21  0158 ? ?  ? ?History ? ?Chief Complaint  ?Patient presents with  ? Constipation  ? Abdominal Pain  ? ? ?Richard Schmitt is a 31 y.o. male. ? ?HPI ?Adult male presents with abdominal pain nausea, vomiting, lack of bowel movements.  There is no history of abdominal surgery has 1 episode of constipation requiring medical intervention in the distant past.  Now over the past 2 days he has had pain focally in the left lower quadrant, with postprandial emesis after intake of both liquids and solids.  No relief with anything.  No fever, no other complaints. ?  ? ?Home Medications ?Prior to Admission medications   ?Not on File  ?   ? ?Allergies    ?Peanut-containing drug products   ? ?Review of Systems   ?Review of Systems  ?Constitutional:   ?     Per HPI, otherwise negative  ?HENT:    ?     Per HPI, otherwise negative  ?Respiratory:    ?     Per HPI, otherwise negative  ?Cardiovascular:   ?     Per HPI, otherwise negative  ?Gastrointestinal:  Positive for abdominal pain, nausea and vomiting.  ?Endocrine:  ?     Negative aside from HPI  ?Genitourinary:   ?     Neg aside from HPI   ?Musculoskeletal:   ?     Per HPI, otherwise negative  ?Skin: Negative.   ?Neurological:  Negative for syncope.  ? ?Physical Exam ?Updated Vital Signs ?BP (!) 143/93 (BP Location: Right Arm)   Pulse 81   Temp 99.1 ?F (37.3 ?C) (Oral)   Resp 17   Ht 5' 7.5" (1.715 m)   Wt 88.9 kg   SpO2 97%   BMI 30.24 kg/m?  ?Physical Exam ?Vitals and nursing note reviewed.  ?Constitutional:   ?   General: He is not in acute distress. ?   Appearance: He is well-developed.  ?HENT:  ?   Head: Normocephalic and atraumatic.  ?Eyes:  ?   Conjunctiva/sclera: Conjunctivae normal.  ?Cardiovascular:  ?   Rate and Rhythm: Normal rate and regular rhythm.  ?Pulmonary:  ?   Effort: Pulmonary effort is normal. No respiratory distress.  ?   Breath  sounds: No stridor.  ?Abdominal:  ?   General: There is no distension.  ?   Tenderness: There is abdominal tenderness in the right lower quadrant. There is guarding.  ?   Comments: Pain in RLQ w palp in LLQ  ?Skin: ?   General: Skin is warm and dry.  ?Neurological:  ?   Mental Status: He is alert and oriented to person, place, and time.  ? ? ?ED Results / Procedures / Treatments   ?Labs ?(all labs ordered are listed, but only abnormal results are displayed) ?Labs Reviewed  ?COMPREHENSIVE METABOLIC PANEL - Abnormal; Notable for the following components:  ?    Result Value  ? Glucose, Bld 103 (*)   ? Creatinine, Ser 1.89 (*)   ? GFR, Estimated 49 (*)   ? All other components within normal limits  ?CBC - Abnormal; Notable for the following components:  ? WBC 18.0 (*)   ? All other components within normal limits  ?URINALYSIS, ROUTINE W REFLEX MICROSCOPIC - Abnormal; Notable for the following components:  ? Hgb urine dipstick SMALL (*)   ? Ketones, ur 80 (*)   ?  All other components within normal limits  ?RESP PANEL BY RT-PCR (FLU A&B, COVID) ARPGX2  ?LIPASE, BLOOD  ? ? ?EKG ?None ? ?Radiology ?No results found. ? ?Procedures ?Procedures  ? ? ?Medications Ordered in ED ?Medications  ?lactated ringers bolus 1,000 mL (has no administration in time range)  ?Glycerin (Adult) 2 g suppository 1 suppository (has no administration in time range)  ?sodium chloride 0.9 % bolus 1,000 mL (has no administration in time range)  ? ? ?ED Course/ Medical Decision Making/ A&P ?Clinical Course as of 11/02/21 1231  ?Sun Nov 02, 2021  ?0723 WBC(!): 18.0 [JL]  ?7035 Creatinine(!): 1.89 [JL]  ?  ?Clinical Course User Index ?[JL] Ernie Avena, MD  ? ?                        ?This patient with a Hx of no substantial medical problems presents to the ED for concern of lower abdominal pain nausea, vomiting, and lack of bowel movements, this involves an extensive number of treatment options, and is a complaint that carries with it a high risk of  complications and morbidity.   ? ?The differential diagnosis includes bowel obstruction, constipation, ileus, intra-abdominal infection such as appendicitis ? ? ?Social Determinants of Health: ? ?No limits ? ?Additional history obtained: ? ?Additional history and/or information obtained from chart review, notable for visit 2 years ago for constipation and initiation of MiraLAX ? ? ?After the initial evaluation, orders, including: CT, after labs were started prior to my evaluation were initiated. ? ? ?Patient placed on Cardiac and Pulse-Oximetry Monitors. ?The patient was maintained on a cardiac monitor.  The cardiac monitored showed an rhythm of 80 sinus normal ?The patient was also maintained on pulse oximetry. The readings were typically 100% room air normal ? ? ?On repeat evaluation of the patient stayed the same ? ?Lab Tests: ? ?I personally interpreted labs.  The pertinent results include: Leukocytosis, ketonuria, concerning for infection versus inflammation with dehydration ? ?Imaging Studies ordered: ? ?I independently visualized and interpreted imaging which showed 6 mm proximal left kidney stone ?I agree with the radiologist interpretation ? ?Consultations Obtained: ? ?I requested consultation with the urology, Dr. Mena Goes,  and discussed lab and imaging findings as well as pertinent plan - they recommend: Outpatient follow-up, patient will call the clinic tomorrow ? ?Dispostion / Final MDM: ? ?After consideration of the diagnostic results and the patient's response to treatment, patient has improved, is resting, awakens easily, nausea has improved/resolved. ?12:32 PM ?Patient in no distress, hemodynamically unremarkable.  After discussing his case with our urologist, Dr. Mena Goes patient has received Toradol, will follow-up in the urology clinic, facilitated by Dr. Mena Goes.  No evidence for concurrent infection.  Patient did present with nausea, pain, has some evidence for substantial dehydration given  ketonuria, leukocytosis, but no evidence for bacteremia, sepsis, decompensated state with no fever, no hypotension.  Given initial ketone urea, worsening renal function hospitalization was a consideration, but with his improvement here patient amenable to plan, discharged in stable condition. ? ?Final Clinical Impression(s) / ED Diagnoses ?Final diagnoses:  ?Left lower quadrant abdominal pain  ?Nephrolithiasis  ? ?  ?Gerhard Munch, MD ?11/02/21 1233 ? ?

## 2022-01-02 NOTE — Progress Notes (Signed)
I called the [facility] scheduler for Dr. Laurette Schimke Mabe to request preop orders for surgery.  No answer, lvm with pt information for request.   DOS 01/23/2022   Preop 01/09/2022

## 2022-01-08 ENCOUNTER — Encounter (HOSPITAL_COMMUNITY): Payer: Self-pay

## 2022-01-08 ENCOUNTER — Other Ambulatory Visit: Payer: Self-pay | Admitting: Urology

## 2022-01-08 NOTE — Progress Notes (Signed)
Surgery orders requested with Coni at Dr. Alphonsa Overall office (voicemail left)

## 2022-01-08 NOTE — Patient Instructions (Addendum)
DUE TO COVID-19 ONLY TWO VISITORS  (aged 30 and older)  IS ALLOWED TO COME WITH YOU AND STAY IN THE WAITING ROOM ONLY DURING PRE OP AND PROCEDURE.   **NO VISITORS ARE ALLOWED IN THE SHORT STAY AREA OR RECOVERY ROOM!!**   You are not required to quarantine Hand Hygiene often Do NOT share personal items Notify your provider if you are in close contact with someone who has COVID or you develop fever 100.4 or greater, new onset of sneezing, cough, sore throat, shortness of breath or body aches.        Your procedure is scheduled on:  01-23-22   Report to [facility] Main Entrance    Report to admitting at 11:15 AM   Call this number if you have problems the morning of surgery [phone]   Do not eat food :After Midnight the night before surgery   After Midnight you may have the following liquids until 10:30 AM DAY OF SURGERY  Clear Liquid Diet Water Black Coffee (sugar ok, NO MILK/CREAM OR CREAMERS)  Tea (sugar ok, NO MILK/CREAM OR CREAMERS) regular and decaf                             Plain Jell-O (NO RED)                                           Fruit ices (not with fruit pulp, NO RED)                                     Popsicles (NO RED)                                                                  Juice: apple, WHITE grape, WHITE cranberry Sports drinks like Gatorade (NO RED)           If you have questions, please contact your surgeon's office.   FOLLOW  ANY ADDITIONAL PRE OP INSTRUCTIONS YOU RECEIVED FROM YOUR SURGEON'S OFFICE!!!     Oral Hygiene is also important to reduce your risk of infection.                                    Remember - BRUSH YOUR TEETH THE MORNING OF SURGERY WITH YOUR REGULAR TOOTHPASTE   Do NOT smoke after Midnight   Take these medicines the morning of surgery with A SIP OF WATER: None   DO NOT BRING YOUR HOME MEDICATIONS TO THE HOSPITAL. PHARMACY WILL DISPENSE MEDICATIONS LISTED ON YOUR MEDICATION LIST TO YOU DURING YOUR  ADMISSION IN THE HOSPITAL!                             You may not have any metal on your body including  jewelry, and body piercing             Do not wear  lotions, powders, cologne,  or deodorant              Men may shave face and neck.    Contacts, dentures or bridgework may not be worn into surgery.  Do not bring valuables to the hospital. [facility] IS NOT RESPONSIBLE   FOR VALUABLES.   Patients discharged on the day of surgery will not be allowed to drive home.  Someone NEEDS to stay with you for the first 24 hours after anesthesia.  Please read over the following fact sheets you were given: IF YOU HAVE QUESTIONS ABOUT YOUR PRE-OP INSTRUCTIONS PLEASE CALL [phone] [facility] - Preparing for Surgery Before surgery, you can play an important role.  Because skin is not sterile, your skin needs to be as free of germs as possible.  You can reduce the number of germs on your skin by washing with CHG (chlorahexidine gluconate) soap before surgery.  CHG is an antiseptic cleaner which kills germs and bonds with the skin to continue killing germs even after washing. Please DO NOT use if you have an allergy to CHG or antibacterial soaps.  If your skin becomes reddened/irritated stop using the CHG and inform your nurse when you arrive at Short Stay. Do not shave (including legs and underarms) for at least 48 hours prior to the first CHG shower.  You may shave your face/neck.  Please follow these instructions carefully:  1.  Shower with CHG Soap the night before surgery and the  morning of surgery.  2.  If you choose to wash your hair, wash your hair first as usual with your normal  shampoo.  3.  After you shampoo, rinse your hair and body thoroughly to remove the shampoo.                             4.  Use CHG as you would any other liquid soap.  You can apply chg directly to the skin and wash.  Gently with a scrungie or clean washcloth.  5.  Apply the CHG Soap to your body ONLY  FROM THE NECK DOWN.   Do   not use on face/ open                           Wound or open sores. Avoid contact with eyes, ears mouth and   genitals (private parts).                       Wash face,  Genitals (private parts) with your normal soap.             6.  Wash thoroughly, paying special attention to the area where your    surgery  will be performed.  7.  Thoroughly rinse your body with warm water from the neck down.  8.  DO NOT shower/wash with your normal soap after using and rinsing off the CHG Soap.                9.  Pat yourself dry with a clean towel.            10.  Wear clean pajamas.            11.  Place clean sheets on your bed the night of your first shower and do not  sleep with pets. Day of Surgery : Do not apply any lotions/deodorants the morning  of surgery.  Please wear clean clothes to the hospital/surgery center.  FAILURE TO FOLLOW THESE INSTRUCTIONS MAY RESULT IN THE CANCELLATION OF YOUR SURGERY  PATIENT SIGNATURE_________________________________  NURSE SIGNATURE__________________________________  ________________________________________________________________________

## 2022-01-09 ENCOUNTER — Encounter (HOSPITAL_COMMUNITY): Payer: Self-pay

## 2022-01-09 ENCOUNTER — Other Ambulatory Visit: Payer: Self-pay

## 2022-01-09 ENCOUNTER — Encounter (HOSPITAL_COMMUNITY)
Admission: RE | Admit: 2022-01-09 | Discharge: 2022-01-09 | Disposition: A | Discharge status: Home / Self Care | Payer: Self-pay | Source: Ambulatory Visit | Attending: Urology | Admitting: Urology

## 2022-01-09 VITALS — BP 143/95 | HR 63 | Temp 99.0°F | Resp 14 | Ht 68.0 in | Wt 157.4 lb

## 2022-01-09 DIAGNOSIS — N289 Disorder of kidney and ureter, unspecified: Secondary | ICD-10-CM | POA: Insufficient documentation

## 2022-01-09 DIAGNOSIS — Z01812 Encounter for preprocedural laboratory examination: Secondary | ICD-10-CM | POA: Insufficient documentation

## 2022-01-09 HISTORY — DX: Personal history of urinary calculi: Z87.442

## 2022-01-09 LAB — BASIC METABOLIC PANEL
Anion gap: 6 (ref 5–15)
BUN: 15 mg/dL (ref 6–20)
CO2: 27 mmol/L (ref 22–32)
Calcium: 9.6 mg/dL (ref 8.9–10.3)
Chloride: 107 mmol/L (ref 98–111)
Creatinine, Ser: 1.06 mg/dL (ref 0.61–1.24)
GFR, Estimated: >60 mL/min (ref >60)
Glucose, Bld: 95 mg/dL (ref 70–99)
Potassium: 3.9 mmol/L (ref 3.5–5.1)
Sodium: 140 mmol/L (ref 135–145)

## 2022-01-09 NOTE — Progress Notes (Signed)
COVID Vaccine Completed: Yes  Date of COVID positive in last 90 days: No  PCP - No PCP Cardiologist - N/A  Chest x-ray - N/A EKG - N/A Stress Test - N/A ECHO - N/A Cardiac Cath - N/A Pacemaker/ICD device last checked: Spinal Cord Stimulator:N/A  Bowel Prep - N/A  Sleep Study - N/A CPAP -   Fasting Blood Sugar - N/A Checks Blood Sugar _____ times a day  Blood Thinner Instructions:N/A Aspirin Instructions: Last Dose:  Activity level:   Can go up a flight of stairs and perform activities of daily living without stopping and without symptoms of chest pain or shortness of breath.  Able to exercise without symptoms  Unable to go up a flight of stairs without symptoms of     Anesthesia review: N/A  Patient denies shortness of breath, fever, cough and chest pain at PAT appointment  Patient verbalized understanding of instructions that were given to them at the PAT appointment. Patient was also instructed that they will need to review over the PAT instructions again at home before surgery.

## 2022-01-23 ENCOUNTER — Ambulatory Visit (HOSPITAL_COMMUNITY)
Admission: RE | Admit: 2022-01-23 | Discharge: 2022-01-23 | Disposition: A | Discharge status: Home / Self Care | Payer: Commercial Managed Care - HMO | Source: Ambulatory Visit | Attending: Urology | Admitting: Urology

## 2022-01-23 ENCOUNTER — Ambulatory Visit (HOSPITAL_COMMUNITY): Payer: Commercial Managed Care - HMO

## 2022-01-23 ENCOUNTER — Ambulatory Visit (HOSPITAL_BASED_OUTPATIENT_CLINIC_OR_DEPARTMENT_OTHER): Payer: Commercial Managed Care - HMO | Admitting: Anesthesiology

## 2022-01-23 ENCOUNTER — Other Ambulatory Visit: Payer: Self-pay | Admitting: Urology

## 2022-01-23 ENCOUNTER — Encounter (HOSPITAL_COMMUNITY): Payer: Self-pay | Admitting: Urology

## 2022-01-23 ENCOUNTER — Ambulatory Visit (HOSPITAL_COMMUNITY): Payer: Commercial Managed Care - HMO | Admitting: Anesthesiology

## 2022-01-23 ENCOUNTER — Encounter (HOSPITAL_COMMUNITY)
Admission: RE | Disposition: A | Discharge status: Home / Self Care | Payer: Self-pay | Source: Ambulatory Visit | Attending: Urology

## 2022-01-23 DIAGNOSIS — N200 Calculus of kidney: Secondary | ICD-10-CM | POA: Diagnosis present

## 2022-01-23 DIAGNOSIS — F17200 Nicotine dependence, unspecified, uncomplicated: Secondary | ICD-10-CM | POA: Diagnosis not present

## 2022-01-23 HISTORY — PX: CYSTOSCOPY/URETEROSCOPY/HOLMIUM LASER/STENT PLACEMENT: SHX6546

## 2022-01-23 SURGERY — CYSTOSCOPY/URETEROSCOPY/HOLMIUM LASER/STENT PLACEMENT
Anesthesia: General | Laterality: Left

## 2022-01-23 MED ORDER — OXYCODONE HCL 5 MG/5ML PO SOLN: 5.0000 mg | Freq: Once | ORAL | Status: DC | PRN

## 2022-01-23 MED ORDER — DEXAMETHASONE SODIUM PHOSPHATE 10 MG/ML IJ SOLN
INTRAMUSCULAR | Status: AC
  Filled 2022-01-23: qty 2

## 2022-01-23 MED ORDER — HYDROMORPHONE HCL 1 MG/ML IJ SOLN: 0.2500 mg | INTRAMUSCULAR | Status: DC | PRN

## 2022-01-23 MED ORDER — DEXAMETHASONE SODIUM PHOSPHATE 10 MG/ML IJ SOLN
INTRAMUSCULAR | Status: AC
  Filled 2022-01-23: qty 1

## 2022-01-23 MED ORDER — IOHEXOL 300 MG/ML  SOLN
INTRAMUSCULAR | Status: DC | PRN
  Administered 2022-01-23: 4 mL

## 2022-01-23 MED ORDER — SODIUM CHLORIDE 0.9 % IR SOLN
Status: DC | PRN
  Administered 2022-01-23: 3000 mL via INTRAVESICAL

## 2022-01-23 MED ORDER — ONDANSETRON HCL 4 MG/2ML IJ SOLN
INTRAMUSCULAR | Status: DC | PRN
  Administered 2022-01-23: 4 mg via INTRAVENOUS

## 2022-01-23 MED ORDER — EPHEDRINE 5 MG/ML INJ
INTRAVENOUS | Status: AC
  Filled 2022-01-23: qty 5

## 2022-01-23 MED ORDER — EPHEDRINE SULFATE (PRESSORS) 50 MG/ML IJ SOLN
INTRAMUSCULAR | Status: DC | PRN
  Administered 2022-01-23 (×3): 5 mg via INTRAVENOUS

## 2022-01-23 MED ORDER — ORAL CARE MOUTH RINSE: 15.0000 mL | Freq: Once | OROMUCOSAL | Status: AC

## 2022-01-23 MED ORDER — GLYCOPYRROLATE 0.2 MG/ML IJ SOLN
INTRAMUSCULAR | Status: AC
  Filled 2022-01-23: qty 2

## 2022-01-23 MED ORDER — CEFAZOLIN SODIUM-DEXTROSE 2-4 GM/100ML-% IV SOLN
2.0000 g | INTRAVENOUS | Status: AC
  Administered 2022-01-23: 2 g via INTRAVENOUS
  Filled 2022-01-23: qty 100

## 2022-01-23 MED ORDER — FENTANYL CITRATE (PF) 100 MCG/2ML IJ SOLN
INTRAMUSCULAR | Status: AC
  Filled 2022-01-23: qty 2

## 2022-01-23 MED ORDER — PROPOFOL 10 MG/ML IV BOLUS
INTRAVENOUS | Status: AC
  Filled 2022-01-23: qty 20

## 2022-01-23 MED ORDER — OXYCODONE HCL 5 MG PO TABS: 5.0000 mg | ORAL_TABLET | Freq: Once | ORAL | Status: DC | PRN

## 2022-01-23 MED ORDER — DEXAMETHASONE SODIUM PHOSPHATE 4 MG/ML IJ SOLN
INTRAMUSCULAR | Status: DC | PRN
  Administered 2022-01-23: 4 mg via INTRAVENOUS

## 2022-01-23 MED ORDER — LACTATED RINGERS IV SOLN: INTRAVENOUS | Status: DC

## 2022-01-23 MED ORDER — OXYCODONE-ACETAMINOPHEN 5-325 MG PO TABS: 1.0000 | ORAL_TABLET | ORAL | 0 refills | Status: DC | PRN

## 2022-01-23 MED ORDER — ONDANSETRON HCL 4 MG/2ML IJ SOLN
INTRAMUSCULAR | Status: AC
Start: 2022-01-23 — End: ?
  Filled 2022-01-23: qty 2

## 2022-01-23 MED ORDER — TAMSULOSIN HCL 0.4 MG PO CAPS
0.4000 mg | ORAL_CAPSULE | Freq: Every day | ORAL | 0 refills | Status: AC
Start: 2022-01-23 — End: ?

## 2022-01-23 MED ORDER — ONDANSETRON HCL 4 MG/2ML IJ SOLN: 4.0000 mg | Freq: Once | INTRAMUSCULAR | Status: DC | PRN

## 2022-01-23 MED ORDER — MIDAZOLAM HCL 2 MG/2ML IJ SOLN
INTRAMUSCULAR | Status: AC
  Filled 2022-01-23: qty 2

## 2022-01-23 MED ORDER — LIDOCAINE HCL (CARDIAC) PF 100 MG/5ML IV SOSY
PREFILLED_SYRINGE | INTRAVENOUS | Status: DC | PRN
  Administered 2022-01-23: 80 mg via INTRAVENOUS

## 2022-01-23 MED ORDER — CHLORHEXIDINE GLUCONATE 0.12 % MT SOLN
15.0000 mL | Freq: Once | OROMUCOSAL | Status: AC
  Administered 2022-01-23: 15 mL via OROMUCOSAL

## 2022-01-23 MED ORDER — PROPOFOL 10 MG/ML IV BOLUS
INTRAVENOUS | Status: DC | PRN
  Administered 2022-01-23: 200 mg via INTRAVENOUS

## 2022-01-23 MED ORDER — LIDOCAINE HCL (PF) 2 % IJ SOLN
INTRAMUSCULAR | Status: AC
  Filled 2022-01-23: qty 5

## 2022-01-23 MED ORDER — ONDANSETRON HCL 4 MG/2ML IJ SOLN
INTRAMUSCULAR | Status: AC
  Filled 2022-01-23: qty 4

## 2022-01-23 MED ORDER — ROCURONIUM BROMIDE 10 MG/ML (PF) SYRINGE
PREFILLED_SYRINGE | INTRAVENOUS | Status: AC
  Filled 2022-01-23: qty 10

## 2022-01-23 MED ORDER — MIDAZOLAM HCL 5 MG/5ML IJ SOLN
INTRAMUSCULAR | Status: DC | PRN
  Administered 2022-01-23: 2 mg via INTRAVENOUS

## 2022-01-23 MED ORDER — GLYCOPYRROLATE 0.2 MG/ML IJ SOLN
INTRAMUSCULAR | Status: AC
Start: 2022-01-23 — End: ?
  Filled 2022-01-23: qty 1

## 2022-01-23 MED ORDER — FENTANYL CITRATE (PF) 100 MCG/2ML IJ SOLN
INTRAMUSCULAR | Status: DC | PRN
  Administered 2022-01-23: 25 ug via INTRAVENOUS
  Administered 2022-01-23: 75 ug via INTRAVENOUS

## 2022-01-23 SURGICAL SUPPLY — 24 items
BAG URO CATCHER STRL LF (MISCELLANEOUS) ×2 IMPLANT
BASKET ZERO TIP NITINOL 2.4FR (BASKET) IMPLANT
BSKT STON RTRVL ZERO TP 2.4FR (BASKET)
CATH URETERAL DUAL LUMEN 10F (MISCELLANEOUS) ×1 IMPLANT
CATH URETL OPEN 5X70 (CATHETERS) ×2 IMPLANT
CLOTH BEACON ORANGE TIMEOUT ST (SAFETY) ×2 IMPLANT
EXTRACTOR STONE NITINOL NGAGE (UROLOGICAL SUPPLIES) IMPLANT
GLOVE SURG LX 7.5 STRW (GLOVE) ×1
GLOVE SURG LX STRL 7.5 STRW (GLOVE) ×1 IMPLANT
GOWN STRL REUS W/ TWL XL LVL3 (GOWN DISPOSABLE) ×1 IMPLANT
GOWN STRL REUS W/TWL XL LVL3 (GOWN DISPOSABLE) ×2
GUIDEWIRE STR DUAL SENSOR (WIRE) ×1 IMPLANT
GUIDEWIRE ZIPWRE .038 STRAIGHT (WIRE) ×2 IMPLANT
KIT TURNOVER KIT A (KITS) ×2 IMPLANT
LASER FIB FLEXIVA PULSE ID 365 (Laser) IMPLANT
MANIFOLD NEPTUNE II (INSTRUMENTS) ×2 IMPLANT
PACK CYSTO (CUSTOM PROCEDURE TRAY) ×2 IMPLANT
SHEATH NAVIGATOR HD 11/13X36 (SHEATH) IMPLANT
STENT URET 6FRX24 CONTOUR (STENTS) ×1 IMPLANT
STENT URET 6FRX26 CONTOUR (STENTS) IMPLANT
TRACTIP FLEXIVA PULS ID 200XHI (Laser) IMPLANT
TRACTIP FLEXIVA PULSE ID 200 (Laser)
TUBING CONNECTING 10 (TUBING) ×2 IMPLANT
TUBING UROLOGY SET (TUBING) ×2 IMPLANT

## 2022-01-23 NOTE — Transfer of Care (Signed)
Immediate Anesthesia Transfer of Care Note  Patient: Richard Schmitt  Procedure(s) Performed: Procedure(s): CYSTOSCOPY/URETEROSCOPY/STENT PLACEMENT (Left)  Patient Location: PACU  Anesthesia Type:General  Level of Consciousness:  sedated, patient cooperative and responds to stimulation  Airway & Oxygen Therapy:Patient Spontanous Breathing and Patient connected to face mask oxgen  Post-op Assessment:  Report given to PACU RN and Post -op Vital signs reviewed and stable  Post vital signs:  Reviewed and stable  Last Vitals:  Vitals:   01/23/22 1048  BP: 138/80  Pulse: (!) 56  Resp: 14  Temp: 37.2 C  SpO2: 100%    Complications: No apparent anesthesia complications

## 2022-01-23 NOTE — Anesthesia Postprocedure Evaluation (Signed)
Anesthesia Post Note  Patient: Charlcie Cradle  Procedure(s) Performed: CYSTOSCOPY/URETEROSCOPY/STENT PLACEMENT (Left)     Patient location during evaluation: PACU Anesthesia Type: General Level of consciousness: awake and alert and oriented Pain management: pain level controlled Vital Signs Assessment: post-procedure vital signs reviewed and stable Respiratory status: spontaneous breathing, nonlabored ventilation and respiratory function stable Cardiovascular status: blood pressure returned to baseline and stable Postop Assessment: no apparent nausea or vomiting Anesthetic complications: no   No notable events documented.  Last Vitals:  Vitals:   01/23/22 1310 01/23/22 1315  BP: (!) 123/58 (!) 125/59  Pulse: 63 (!) 57  Resp: 17 14  Temp: (!) 35.9 C   SpO2: 100% 100%    Last Pain:  Vitals:   01/23/22 1310  TempSrc:   PainSc: Asleep                 Mannie Wineland A.

## 2022-01-23 NOTE — Op Note (Signed)
Preoperative Diagnosis: Left nephrolithiasis  Postoperative Diagnosis:  Same  Procedure(s) Performed:   - Cystourethroscopy - Left retrograde pyelogram - Left ureteral stent placement - Intraoperative fluoroscopy with interpretation <1hr.   Teaching Surgeon:  Dr. Rhoderick Moody  Resident Surgeon:  Troy Sine, MD  Assistant(s):  None  Anesthesia:  General  Fluids:  See anesthesia record  Estimated blood loss:  0cc  Specimens:  Stone for analysis  Drains:  Left 6Fr x 24cm JJ ureteral stent without string  Complications:  None  Indications: 31 y.o. patient with a history of left nephrolithiasis. Risks & benefits of the procedure discussed with the patient, who wishes to proceed.  Findings:   Normal appearing urethra and bladder mucosa Very right ureter which was unable to accommodate flexible ureteroscope or 10Fr Dual lumen catheter Left retrograde pyelogram with normal caliber ureter, no hydronephrosis, it appears the ureteral stone was in the renal pelvis Given inability to pass instruments, decision was made to stent and return later for lithotripsy, successful 6Fr 24cm JJ stent placed appropriately  Description:  The patient was correctly identified in the preop holding area where written informed consent as well potential risk and complication reviewed. The patient agreed. They were brought back to the operative suite where a preinduction timeout was performed. Once correct information was verified, general anesthesia was induced. They were then gently placed into dorsal lithotomy position with SCDs in place for VTE prophylaxis. They were prepped and draped in the usual sterile fashion and given appropriate preoperative antibiotics. A second timeout was then performed.   We inserted a 31F rigid cystoscope per urethra with copious lubrication and normal saline irrigation running. This demonstrated findings as described above.    We cannulated the left ureteral orifice  with the combination of a sensor wire and 5Fr open ended catheter and retrograde pyelogram was shot demonstrating the above findings. The stone was visualized in the renal pelvis. We then replaced the sensor wire into the right kidney and the 5Fr open ended catheter was removed. We then removed the cystoscope leaving our sensor wire in place.  We attempted to pass the flexible ureteroscope over the sensor wire but were unable to advance it past the ureteral orifice. We then obtained a 23fr dual lumen catheter which we were similarly unable to advance past the ureteral orifice. Using the dual lumen, the sensor wire was exchanged for a glidewire and we again attempted to pass the flexible ureteroscope but were again unsuccessful.  At this point we elected to leave a ureteral stent. A 6Fr 24cm JJ stent was advanced through the rigid cystoscope over the glidewire under direct visual and flouroscopic guidance. Flouroscopy following wire removal demonstrated appropriately placed stent.   Post Op Plan:   1. Discharge when mets PACU criteria 2. Plan for repeat ureteroscopy next week, case requested

## 2022-01-23 NOTE — Anesthesia Procedure Notes (Signed)
Procedure Name: LMA Insertion Date/Time: 01/23/2022 12:49 PM  Performed by: Theodosia Quay, CRNAPre-anesthesia Checklist: Patient identified, Emergency Drugs available, Suction available, Patient being monitored and Timeout performed Patient Re-evaluated:Patient Re-evaluated prior to induction Oxygen Delivery Method: Circle system utilized Preoxygenation: Pre-oxygenation with 100% oxygen Induction Type: IV induction Ventilation: Mask ventilation without difficulty LMA: LMA inserted LMA Size: 4.0 Tube size: 4.0 mm Number of attempts: 1 Placement Confirmation: positive ETCO2 and breath sounds checked- equal and bilateral Tube secured with: Tape Dental Injury: Teeth and Oropharynx as per pre-operative assessment

## 2022-01-23 NOTE — Discharge Instructions (Addendum)
The ureter (tube that drains the kidney into the bladder) was too tight to safely accommodate our instruments. Because of that, a ureteral stent was placed. This will allow for gentle stretching of the ureter so that we will be able to attempt the procedure again in the future. Typically this takes a few days and we are working to get you rescheduled next week. Please use tylenol and ibuprofen for pain control. Flomax has also been prescribed which can help with stent discomfort and should be taken once each night.  You may see some blood in the urine and may have some burning with urination for 48-72 hours. You also may notice that you have to urinate more frequently or urgently after your procedure which is normal.  You should call should you develop an inability urinate, fever > 101, persistent nausea and vomiting that prevents you from eating or drinking to stay hydrated.  If you have a stent, you will likely urinate more frequently and urgently until the stent is removed and you may experience some discomfort/pain in the lower abdomen and flank especially when urinating. You may take pain medication prescribed to you if needed for pain. You may also intermittently have blood in the urine until the stent is removed. If you have a catheter, you will be taught how to take care of the catheter by the nursing staff prior to discharge from the hospital.  You may periodically feel a strong urge to void with the catheter in place.  This is a bladder spasm and most often can occur when having a bowel movement or moving around. It is typically self-limited and usually will stop after a few minutes.  You may use some Vaseline or Neosporin around the tip of the catheter to reduce friction at the tip of the penis. You may also see some blood in the urine.  A very small amount of blood can make the urine look quite red.  As long as the catheter is draining well, there usually is not a problem.  However, if the catheter  is not draining well and is bloody, you should call the office [phone]) to notify us.

## 2022-01-23 NOTE — Anesthesia Preprocedure Evaluation (Addendum)
Anesthesia Evaluation  Patient identified by MRN, date of birth, ID band Patient awake    Reviewed: Allergy & Precautions, NPO status , Patient's Chart, lab work & pertinent test results  Airway Mallampati: I  TM Distance: >3 FB Neck ROM: Full    Dental no notable dental hx. (+) Teeth Intact, Dental Advisory Given   Pulmonary neg pulmonary ROS,    Pulmonary exam normal breath sounds clear to auscultation       Cardiovascular negative cardio ROS Normal cardiovascular exam Rhythm:Regular Rate:Normal     Neuro/Psych negative neurological ROS  negative psych ROS   GI/Hepatic negative GI ROS, Neg liver ROS,   Endo/Other  negative endocrine ROS  Renal/GU Left ureteral calculus  negative genitourinary   Musculoskeletal negative musculoskeletal ROS (+)   Abdominal   Peds  Hematology negative hematology ROS (+)   Anesthesia Other Findings   Reproductive/Obstetrics                            Anesthesia Physical Anesthesia Plan  ASA: 2  Anesthesia Plan: General   Post-op Pain Management: Dilaudid IV and Tylenol PO (pre-op)*   Induction: Intravenous  PONV Risk Score and Plan: 4 or greater and Treatment may vary due to age or medical condition, Midazolam, Dexamethasone and Ondansetron  Airway Management Planned: LMA  Additional Equipment: None  Intra-op Plan:   Post-operative Plan: Extubation in OR  Informed Consent: I have reviewed the patients History and Physical, chart, labs and discussed the procedure including the risks, benefits and alternatives for the proposed anesthesia with the patient or authorized representative who has indicated his/her understanding and acceptance.     Dental advisory given  Plan Discussed with: CRNA and Anesthesiologist  Anesthesia Plan Comments:        Anesthesia Quick Evaluation

## 2022-01-23 NOTE — H&P (Signed)
PRE-OP H&P  Office Visit Report     01/15/2022   --------------------------------------------------------------------------------   Geryl Rankins A. Rasmusson  MRN: [id number]  DOB: 12/25/1990, 31 year old Male  SSN:    PRIMARY CARE:  Kischa Altice A. Liliane Shi, MD  REFERRING:    PROVIDER:  Rhoderick Moody, M.D.  SUPERVISING:  Rhoderick Moody, M.D.  TREATING:  Santiago Bur [facility] Resident)  LOCATION:  [facility] [phone]     --------------------------------------------------------------------------------   CC/HPI: CC: Left ureteropelvic junction calculus   Initial HPI 11/07/2021 (Dr. Alvester Morin):  31 year old male underwent CT scan of the abdomen and pelvis with contrast on 11/02/2021. This revealed a 6 mm left ureteropelvic junction calculus with mild left hydronephrosis. He also had a punctate calculus in the right kidney. He continues to have some bilateral lower back pain. Urinalysis is negative. He has been straining his urine and did not see a stone pass. He states that he was not given any medication to help pass the stone or any pain medication. Denies any fever, chill, nausea, vomiting. Denies difficulty voiding.   Interval 12/04/21:  Returns today for follow up with RUS and KUB. Since his initial appointment he has had intermittent severe left flank pain. Other times he has no pain. He denies fevers, chills, nausea vomiting. He has been taking flomax daily. Occasionally has needed to take pain mediations, otherwise tries to tough it out. No voiding issues.   Interval 01/15/22:  returns today for pre-op appointment prior to scheduled ureteroscopy on 01/23/22. Still having some intermittent flank pain but improved from prior and episodes are less frequent. Has not noticed passage of any kidney stones. Discussed extensively operative expectations including but not limited to likely ureteral stent placement. No blood thinners, no significant past medical history, he is ready to  proceed with surgery 01/23/22.     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   Tobacco Use Assessment Completed: Used Tobacco in last 30 days? Drinks 3 caffeinated drinks per day.    REVIEW OF SYSTEMS:    GU Review Male:   Patient denies frequent urination, hard to postpone urination, burning/ pain with urination, get up at night to urinate, leakage of urine, stream starts and stops, trouble starting your stream, have to strain to urinate , erection problems, and penile pain.  Gastrointestinal (Upper):   Patient denies nausea, vomiting, and indigestion/ heartburn.  Gastrointestinal (Lower):   Patient denies diarrhea and constipation.  Constitutional:   Patient denies fever, night sweats, weight loss, and fatigue.  Skin:   Patient denies skin rash/ lesion and itching.  Eyes:   Patient denies blurred vision and double vision.  Ears/ Nose/ Throat:   Patient denies sore throat and sinus problems.  Hematologic/Lymphatic:   Patient denies swollen glands and easy bruising.  Cardiovascular:   Patient denies leg swelling and chest pains.  Respiratory:   Patient denies cough and shortness of breath.  Endocrine:   Patient denies excessive thirst.  Musculoskeletal:   Patient denies back pain and joint  pain.  Neurological:   Patient denies headaches and dizziness.  Psychologic:   Patient denies depression and anxiety.   VITAL SIGNS:      01/15/2022 01:49 PM  Weight 157 lb / 71.21 kg  BP 131/80 mmHg  Pulse 57 /min   Temperature 97.7 F / 36.5 C   MULTI-SYSTEM PHYSICAL EXAMINATION:    Constitutional: Well-nourished. No physical deformities. Normally developed. Good grooming.   Neck: Neck symmetrical, not swollen. Normal tracheal position.   Respiratory: No labored breathing, no use of accessory muscles.   Musculoskeletal: Normal gait and station of head and neck.     Complexity of Data:  Source Of History:  Patient, Healthcare Provider  Lab Test Review:   BMP  Records Review:   Previous Doctor Records, Previous Hospital Records  X-Ray Review: C.T. Chest/ Abd/Pelvis: Reviewed Films. Reviewed Report. Discussed With Patient.     PROCEDURES:          Urinalysis w/Scope Dipstick Dipstick Cont'd Micro  Color: Yellow Bilirubin: Neg mg/dL WBC/hpf: 0 - 5/hpf  Appearance: Clear Ketones: Neg mg/dL RBC/hpf: 10 - 27/OJJ  Specific Gravity: 1.025 Blood: 3+ ery/uL Bacteria: Few (10-25/hpf)  pH: 6.5 Protein: Neg mg/dL Cystals: NS (Not Seen)  Glucose: Neg mg/dL Urobilinogen: 0.2 mg/dL Casts: NS (Not Seen)    Nitrites: Neg Trichomonas: Not Present    Leukocyte Esterase: Neg leu/uL Mucous: Present      Epithelial Cells: 0 - 5/hpf      Yeast: NS (Not Seen)      Sperm: Not Present    ASSESSMENT:      ICD-10 Details  1 GU:   Ureteral calculus - N20.1           Notes:   Discussed expectations and surgical details. All questions answered. No pre-operative concerns, no blood thinners or cardiac concerns. Surgery scheduled for 01/23/22. We will plan to see him back in clinic after procedure in 6-8 weeks for a repeat RUS and KUB    PLAN:           Orders Labs CULTURE, URINE          Schedule Return Visit/Planned Activity: 6-8 Weeks - Office Visit, KUB, Renal Ultrasound (Limited)          Document Letter(s):  Created for Patient: Clinical Summary         Notes:   Surgery scheduled 01/23/22. Follow-up 6-8 weeks with RUS and KUB prior. Urine culture sent today.   Supervised by Dr. Liliane Shi    Interval:   .The risks, benefits and alternatives of cystoscopy with LEFT ureteroscopy, laser lithotripsy and ureteral stent placement was discussed the patient.  Risks included, but are not limited to: bleeding, urinary tract infection, ureteral injury/avulsion, ureteral stricture formation, retained stone fragments, the possibility that multiple surgeries may be required to treat the stone(s), MI, stroke, PE and the inherent risks of general anesthesia.  The patient voices understanding and wishes to proceed.

## 2022-01-24 ENCOUNTER — Encounter (HOSPITAL_COMMUNITY): Payer: Self-pay | Admitting: Urology

## 2022-01-26 NOTE — Progress Notes (Signed)
Spoke to patient and gave him arrival time of 1145 for surgery on 01-28-22.  Patient was reminded no solid food after midnight and that he can have clear liquids from midnight until 1100.  No change in medical history per patient.  Instructions reviewed and all questions answered.

## 2022-01-28 ENCOUNTER — Ambulatory Visit (HOSPITAL_BASED_OUTPATIENT_CLINIC_OR_DEPARTMENT_OTHER): Payer: Commercial Managed Care - HMO | Admitting: Anesthesiology

## 2022-01-28 ENCOUNTER — Ambulatory Visit (HOSPITAL_COMMUNITY): Payer: Commercial Managed Care - HMO | Admitting: Anesthesiology

## 2022-01-28 ENCOUNTER — Ambulatory Visit (HOSPITAL_COMMUNITY): Payer: Commercial Managed Care - HMO

## 2022-01-28 ENCOUNTER — Ambulatory Visit (HOSPITAL_COMMUNITY)
Admission: RE | Admit: 2022-01-28 | Discharge: 2022-01-28 | Disposition: A | Discharge status: Home / Self Care | Payer: Commercial Managed Care - HMO | Attending: Urology | Admitting: Urology

## 2022-01-28 ENCOUNTER — Encounter (HOSPITAL_COMMUNITY)
Admission: RE | Disposition: A | Discharge status: Home / Self Care | Payer: Self-pay | Source: Home / Self Care | Attending: Urology

## 2022-01-28 ENCOUNTER — Encounter (HOSPITAL_COMMUNITY): Payer: Self-pay | Admitting: Urology

## 2022-01-28 ENCOUNTER — Other Ambulatory Visit: Payer: Self-pay

## 2022-01-28 DIAGNOSIS — N201 Calculus of ureter: Secondary | ICD-10-CM | POA: Diagnosis not present

## 2022-01-28 DIAGNOSIS — N289 Disorder of kidney and ureter, unspecified: Secondary | ICD-10-CM

## 2022-01-28 HISTORY — PX: CYSTOSCOPY/URETEROSCOPY/HOLMIUM LASER/STENT PLACEMENT: SHX6546

## 2022-01-28 LAB — BASIC METABOLIC PANEL
Anion gap: 6 (ref 5–15)
BUN: 12 mg/dL (ref 6–20)
CO2: 28 mmol/L (ref 22–32)
Calcium: 9.7 mg/dL (ref 8.9–10.3)
Chloride: 105 mmol/L (ref 98–111)
Creatinine, Ser: 1.05 mg/dL (ref 0.61–1.24)
GFR, Estimated: >60 mL/min (ref >60)
Glucose, Bld: 91 mg/dL (ref 70–99)
Potassium: 4.1 mmol/L (ref 3.5–5.1)
Sodium: 139 mmol/L (ref 135–145)

## 2022-01-28 SURGERY — CYSTOSCOPY/URETEROSCOPY/HOLMIUM LASER/STENT PLACEMENT
Anesthesia: General | Laterality: Left

## 2022-01-28 MED ORDER — ONDANSETRON HCL 4 MG/2ML IJ SOLN
INTRAMUSCULAR | Status: DC | PRN
  Administered 2022-01-28: 4 mg via INTRAVENOUS

## 2022-01-28 MED ORDER — CEPHALEXIN 500 MG PO CAPS: 500.0000 mg | ORAL_CAPSULE | Freq: Two times a day (BID) | ORAL | 0 refills | Status: AC

## 2022-01-28 MED ORDER — ACETAMINOPHEN 500 MG PO TABS
1000.0000 mg | ORAL_TABLET | Freq: Once | ORAL | Status: AC
  Administered 2022-01-28: 1000 mg via ORAL
  Filled 2022-01-28: qty 2

## 2022-01-28 MED ORDER — FENTANYL CITRATE (PF) 100 MCG/2ML IJ SOLN
INTRAMUSCULAR | Status: DC | PRN
Start: 2022-01-28 — End: 2022-01-28
  Administered 2022-01-28 (×2): 50 ug via INTRAVENOUS

## 2022-01-28 MED ORDER — OXYCODONE-ACETAMINOPHEN 5-325 MG PO TABS: 1.0000 | ORAL_TABLET | ORAL | 0 refills | Status: AC | PRN

## 2022-01-28 MED ORDER — CHLORHEXIDINE GLUCONATE 0.12 % MT SOLN
15.0000 mL | Freq: Once | OROMUCOSAL | Status: AC
  Administered 2022-01-28: 15 mL via OROMUCOSAL

## 2022-01-28 MED ORDER — PROPOFOL 10 MG/ML IV BOLUS
INTRAVENOUS | Status: DC | PRN
  Administered 2022-01-28: 50 mg via INTRAVENOUS
  Administered 2022-01-28: 200 mg via INTRAVENOUS

## 2022-01-28 MED ORDER — ONDANSETRON HCL 4 MG/2ML IJ SOLN: 4.0000 mg | Freq: Once | INTRAMUSCULAR | Status: DC | PRN

## 2022-01-28 MED ORDER — LIDOCAINE 2% (20 MG/ML) 5 ML SYRINGE
INTRAMUSCULAR | Status: DC | PRN
  Administered 2022-01-28: 100 mg via INTRAVENOUS

## 2022-01-28 MED ORDER — LIDOCAINE HCL (PF) 2 % IJ SOLN
INTRAMUSCULAR | Status: AC
  Filled 2022-01-28: qty 5

## 2022-01-28 MED ORDER — ORAL CARE MOUTH RINSE: 15.0000 mL | Freq: Once | OROMUCOSAL | Status: AC

## 2022-01-28 MED ORDER — PROPOFOL 10 MG/ML IV BOLUS
INTRAVENOUS | Status: AC
  Filled 2022-01-28: qty 20

## 2022-01-28 MED ORDER — ONDANSETRON HCL 4 MG/2ML IJ SOLN
INTRAMUSCULAR | Status: AC
  Filled 2022-01-28: qty 2

## 2022-01-28 MED ORDER — IOHEXOL 300 MG/ML  SOLN
INTRAMUSCULAR | Status: DC | PRN
  Administered 2022-01-28: 10 mL

## 2022-01-28 MED ORDER — DEXAMETHASONE SODIUM PHOSPHATE 10 MG/ML IJ SOLN
INTRAMUSCULAR | Status: AC
  Filled 2022-01-28: qty 1

## 2022-01-28 MED ORDER — CEFAZOLIN SODIUM-DEXTROSE 2-4 GM/100ML-% IV SOLN
2.0000 g | Freq: Once | INTRAVENOUS | Status: AC
  Administered 2022-01-28: 2 g via INTRAVENOUS
  Filled 2022-01-28: qty 100

## 2022-01-28 MED ORDER — DEXAMETHASONE SODIUM PHOSPHATE 4 MG/ML IJ SOLN
INTRAMUSCULAR | Status: DC | PRN
  Administered 2022-01-28: 8 mg via INTRAVENOUS

## 2022-01-28 MED ORDER — 0.9 % SODIUM CHLORIDE (POUR BTL) OPTIME
TOPICAL | Status: DC | PRN
  Administered 2022-01-28: 1000 mL

## 2022-01-28 MED ORDER — FENTANYL CITRATE (PF) 100 MCG/2ML IJ SOLN
INTRAMUSCULAR | Status: AC
  Filled 2022-01-28: qty 2

## 2022-01-28 MED ORDER — SODIUM CHLORIDE 0.9 % IR SOLN
Status: DC | PRN
  Administered 2022-01-28: 3000 mL

## 2022-01-28 MED ORDER — MIDAZOLAM HCL 2 MG/2ML IJ SOLN
INTRAMUSCULAR | Status: AC
Start: 2022-01-28 — End: ?
  Filled 2022-01-28: qty 2

## 2022-01-28 MED ORDER — LACTATED RINGERS IV SOLN
INTRAVENOUS | Status: DC
Start: 2022-01-28 — End: 2022-01-28

## 2022-01-28 MED ORDER — KETOROLAC TROMETHAMINE 30 MG/ML IJ SOLN
INTRAMUSCULAR | Status: AC
  Filled 2022-01-28: qty 1

## 2022-01-28 MED ORDER — FENTANYL CITRATE PF 50 MCG/ML IJ SOSY
PREFILLED_SYRINGE | INTRAMUSCULAR | Status: AC
  Filled 2022-01-28: qty 1

## 2022-01-28 MED ORDER — MIDAZOLAM HCL 5 MG/5ML IJ SOLN
INTRAMUSCULAR | Status: DC | PRN
  Administered 2022-01-28: 2 mg via INTRAVENOUS

## 2022-01-28 MED ORDER — FENTANYL CITRATE PF 50 MCG/ML IJ SOSY
25.0000 ug | PREFILLED_SYRINGE | INTRAMUSCULAR | Status: DC | PRN
  Administered 2022-01-28: 50 ug via INTRAVENOUS

## 2022-01-28 SURGICAL SUPPLY — 21 items
BAG URO CATCHER STRL LF (MISCELLANEOUS) ×2 IMPLANT
BASKET ZERO TIP NITINOL 2.4FR (BASKET) IMPLANT
CATH URETL OPEN 5X70 (CATHETERS) ×2 IMPLANT
CLOTH BEACON ORANGE TIMEOUT ST (SAFETY) ×2 IMPLANT
EXTRACTOR STONE NITINOL NGAGE (UROLOGICAL SUPPLIES) IMPLANT
GLOVE SURG LX 7.5 STRW (GLOVE) ×1
GLOVE SURG LX STRL 7.5 STRW (GLOVE) ×1 IMPLANT
GOWN STRL REUS W/ TWL XL LVL3 (GOWN DISPOSABLE) ×1 IMPLANT
GOWN STRL REUS W/TWL XL LVL3 (GOWN DISPOSABLE) ×2
GUIDEWIRE STR DUAL SENSOR (WIRE) IMPLANT
GUIDEWIRE ZIPWRE .038 STRAIGHT (WIRE) ×2 IMPLANT
KIT TURNOVER KIT A (KITS) ×2 IMPLANT
LASER FIB FLEXIVA PULSE ID 365 (Laser) IMPLANT
MANIFOLD NEPTUNE II (INSTRUMENTS) ×2 IMPLANT
PACK CYSTO (CUSTOM PROCEDURE TRAY) ×2 IMPLANT
SHEATH NAVIGATOR HD 11/13X36 (SHEATH) IMPLANT
STENT URET 6FRX26 CONTOUR (STENTS) IMPLANT
TRACTIP FLEXIVA PULS ID 200XHI (Laser) IMPLANT
TRACTIP FLEXIVA PULSE ID 200 (Laser) ×2
TUBING CONNECTING 10 (TUBING) ×2 IMPLANT
TUBING UROLOGY SET (TUBING) ×2 IMPLANT

## 2022-01-28 NOTE — Anesthesia Preprocedure Evaluation (Addendum)
Anesthesia Evaluation  Patient identified by MRN, date of birth, ID band Patient awake    Reviewed: Allergy & Precautions, NPO status , Patient's Chart, lab work & pertinent test results  Airway Mallampati: II  TM Distance: >3 FB Neck ROM: Full    Dental  (+) Dental Advisory Given, Poor Dentition, Missing   Pulmonary Current Smoker and Patient abstained from smoking.,    Pulmonary exam normal breath sounds clear to auscultation       Cardiovascular negative cardio ROS Normal cardiovascular exam Rhythm:Regular Rate:Normal     Neuro/Psych negative neurological ROS     GI/Hepatic negative GI ROS, Neg liver ROS,   Endo/Other  negative endocrine ROS  Renal/GU Renal disease (Kidney stones)     Musculoskeletal negative musculoskeletal ROS (+)   Abdominal   Peds  Hematology negative hematology ROS (+)   Anesthesia Other Findings Day of surgery medications reviewed with the patient.  Reproductive/Obstetrics                            Anesthesia Physical Anesthesia Plan  ASA: 2  Anesthesia Plan: General   Post-op Pain Management: Tylenol PO (pre-op)* and Toradol IV (intra-op)*   Induction: Intravenous  PONV Risk Score and Plan: 3 and Midazolam, Dexamethasone and Ondansetron  Airway Management Planned: LMA  Additional Equipment:   Intra-op Plan:   Post-operative Plan: Extubation in OR  Informed Consent: I have reviewed the patients History and Physical, chart, labs and discussed the procedure including the risks, benefits and alternatives for the proposed anesthesia with the patient or authorized representative who has indicated his/her understanding and acceptance.     Dental advisory given  Plan Discussed with: CRNA  Anesthesia Plan Comments:        Anesthesia Quick Evaluation

## 2022-01-28 NOTE — H&P (Addendum)
Urology Preoperative H&P   Chief Complaint: Left UPJ stone  History of Present Illness: Richard Schmitt is a 31 y.o. male with a history of an 8 mm left UPJ stone.  He underwent attempted left ureteroscopy on 01/23/2022, but stone treatment had to be abandoned due to ureteral narrowing and inability to get the ureteroscope up to the level of his stone.  He had a left ureteral stent placed at that time.  He is back today for definitive stone treatment.  He reports stent related flank discomfort and urgency, but denies interval UTIs, dysuria fever/chills or gross hematuria.   Past Medical History:  Diagnosis Date   History of kidney stones     Past Surgical History:  Procedure Laterality Date   CYSTOSCOPY/URETEROSCOPY/HOLMIUM LASER/STENT PLACEMENT Left 01/23/2022   Procedure: CYSTOSCOPY/URETEROSCOPY/STENT PLACEMENT;  Surgeon: Rene Paci, MD;  Location: [facility];  Service: Urology;  Laterality: Left;   WISDOM TOOTH EXTRACTION      Allergies:  Allergies  Allergen Reactions   Peanut-Containing Drug Products Hives    History reviewed. No pertinent family history.  Social History:  reports that he has never smoked. He has never used smokeless tobacco. He reports that he does not drink alcohol and does not use drugs.  ROS: A complete review of systems was performed.  All systems are negative except for pertinent findings as noted.  Physical Exam:  Vital signs in last 24 hours: Temp:  [98.2 F (36.8 C)] 98.2 F (36.8 C) (08/09 1047) Pulse Rate:  [57] 57 (08/09 1047) Resp:  [16] 16 (08/09 1047) BP: (138)/(80) 138/80 (08/09 1047) SpO2:  [100 %] 100 % (08/09 1047) Weight:  [71.2 kg] 71.2 kg (08/09 1042) Constitutional:  Alert and oriented, No acute distress Cardiovascular: Regular rate and rhythm, No JVD Respiratory: Normal respiratory effort, Lungs clear bilaterally GI: Abdomen is soft, nontender, nondistended, no abdominal masses GU: No CVA tenderness Lymphatic: No  lymphadenopathy Neurologic: Grossly intact, no focal deficits Psychiatric: Normal mood and affect  Laboratory Data:  No results for input(s): "WBC", "HGB", "HCT", "PLT" in the last 72 hours.  Recent Labs    01/28/22 1050  NA 139  K 4.1  CL 105  GLUCOSE 91  BUN 12  CALCIUM 9.7  CREATININE 1.05     Results for orders placed or performed during the hospital encounter of 01/28/22 (from the past 24 hour(s))  Basic metabolic panel per protocol     Status: None   Collection Time: 01/28/22 10:50 AM  Result Value Ref Range   Sodium 139 135 - 145 mmol/L   Potassium 4.1 3.5 - 5.1 mmol/L   Chloride 105 98 - 111 mmol/L   CO2 28 22 - 32 mmol/L   Glucose, Bld 91 70 - 99 mg/dL   BUN 12 6 - 20 mg/dL   Creatinine, Ser 2.62 0.61 - 1.24 mg/dL   Calcium 9.7 8.9 - 03.5 mg/dL   GFR, Estimated >59 >74 mL/min   Anion gap 6 5 - 15   No results found for this or any previous visit (from the past 240 hour(s)).  Renal Function: Recent Labs    01/28/22 1050  CREATININE 1.05   Estimated Creatinine Clearance: 100.4 mL/min (by C-G formula based on SCr of 1.05 mg/dL).  Radiologic Imaging: No results found.  I independently reviewed the above imaging studies.  Assessment and Plan Richard Schmitt is a 31 y.o. male with 8 mm left UPJ stone   The risks, benefits and alternatives of cystoscopy  with LEFT ureteroscopy, laser lithotripsy and ureteral stent placement was discussed the patient.  Risks included, but are not limited to: bleeding, urinary tract infection, ureteral injury/avulsion, ureteral stricture formation, retained stone fragments, the possibility that multiple surgeries may be required to treat the stone(s), MI, stroke, PE and the inherent risks of general anesthesia.  The patient voices understanding and wishes to proceed.      Rhoderick Moody, MD 01/28/2022, 11:17 AM  [facility] Pager: [phone]

## 2022-01-28 NOTE — Transfer of Care (Signed)
Immediate Anesthesia Transfer of Care Note  Patient: Richard Schmitt  Procedure(s) Performed: CYSTOSCOPY/ RETROGRADE/URETEROSCOPY/HOLMIUM LASER/STENT EXCHANGE (Left)  Patient Location: PACU  Anesthesia Type:General  Level of Consciousness: awake  Airway & Oxygen Therapy: Patient Spontanous Breathing  Post-op Assessment: Report given to RN  Post vital signs: stable  Last Vitals:  Vitals Value Taken Time  BP 134/93 01/28/22 1345  Temp    Pulse 55 01/28/22 1347  Resp 7 01/28/22 1347  SpO2 100 % 01/28/22 1347  Vitals shown include unvalidated device data.  Last Pain:  Vitals:   01/28/22 1047  TempSrc: Oral         Complications: No notable events documented.

## 2022-01-28 NOTE — Anesthesia Postprocedure Evaluation (Signed)
Anesthesia Post Note  Patient: Richard Schmitt  Procedure(s) Performed: CYSTOSCOPY/ RETROGRADE/URETEROSCOPY/HOLMIUM LASER/STENT EXCHANGE (Left)     Patient location during evaluation: PACU Anesthesia Type: General Level of consciousness: awake and alert Pain management: pain level controlled Vital Signs Assessment: post-procedure vital signs reviewed and stable Respiratory status: spontaneous breathing, nonlabored ventilation, respiratory function stable and patient connected to nasal cannula oxygen Cardiovascular status: blood pressure returned to baseline and stable Postop Assessment: no apparent nausea or vomiting Anesthetic complications: no   No notable events documented.  Last Vitals:  Vitals:   01/28/22 1415 01/28/22 1420  BP: (!) 147/83   Pulse: 71   Resp: 12   Temp:  36.6 C  SpO2: 100%     Last Pain:  Vitals:   01/28/22 1420  TempSrc: Oral  PainSc: 0-No pain                 Collene Schlichter

## 2022-01-28 NOTE — Op Note (Signed)
Operative Note  Preoperative diagnosis:  1.  8 mm left UPJ stone  Postoperative diagnosis: 1.  8 mm left UPJ stone  Procedure(s): 1.  Cystoscopy with left ureteroscopy, holmium laser lithotripsy and left JJ stent placement  Surgeon: Rhoderick Moody, MD  Assistants:  None  Anesthesia:  General  Complications:  None  EBL: Less than 5 mL  Specimens: 1.  Previously placed left ureteral stent was removed intact, inspected and discarded  Drains/Catheters: 1.  Left 6 French, 26 cm JJ stent with tether  Intraoperative findings:   8 millimeter left UPJ stone  Indication:  Richard Schmitt is a 31 y.o. male with an 8 mm left UPJ stone.  He is status post left ureteral stent placement on 01/23/2022 due to ureteral narrowing and is here today for definitive stone treatment.  Description of procedure:  After informed consent was obtained, the patient was brought to the operating room and general LMA anesthesia was administered. The patient was then placed in the dorsolithotomy position and prepped and draped in the usual sterile fashion. A timeout was performed. A 23 French rigid cystoscope was then inserted into the urethral meatus and advanced into the bladder under direct vision. A complete bladder survey revealed no intravesical pathology.  His previously placed left ureteral stent was then grasped his distal curl and retracted to the urethral meatus.  A Glidewire was then advanced through the lumen of the stent up to the left renal pelvis, or fluoroscopic guidance.  The previously placed stent was then removed intact, inspected and discarded.  A flexible ureteroscope was then advanced over the wire and up to the left UPJ.  The stone was quickly identified and migrated into a midpole calyx.  A 200 m holmium laser was then used to dust the stone into 2 mm or less fragments.  The flexible ureteroscope was then removed under direct vision, identifying no evidence of ureteral trauma or  residual stone burden.  The rigid cystoscope was then replaced back into the bladder over the wire.  A 6 French, 24 cm JJ stent was then advanced over the wire and into good position within the left collecting system, confirmed placement via fluoroscopy.  The tether of the stent was left intact.  Patient's bladder was drained.  The tail of the stent was then secured to the dorsum of the penis with benzoin and Steri-Strips.  He is transferred to the postanesthesia in stable condition.  Plan: Follow-up in 6 weeks for left renal ultrasound

## 2022-01-28 NOTE — Anesthesia Procedure Notes (Signed)
Procedure Name: LMA Insertion Date/Time: 01/28/2022 12:39 PM  Performed by: Quantisha Marsicano, Merrily Pew, CRNAPre-anesthesia Checklist: Patient identified, Emergency Drugs available, Suction available, Patient being monitored and Timeout performed Patient Re-evaluated:Patient Re-evaluated prior to induction Oxygen Delivery Method: Circle system utilized Preoxygenation: Pre-oxygenation with 100% oxygen Induction Type: IV induction Ventilation: Mask ventilation without difficulty LMA: LMA with gastric port inserted LMA Size: 4.0 Number of attempts: 1 Dental Injury: Teeth and Oropharynx as per pre-operative assessment

## 2022-01-29 ENCOUNTER — Encounter (HOSPITAL_COMMUNITY): Payer: Self-pay | Admitting: Urology
